# Patient Record
Sex: Female | Born: 1950 | Race: White | Hispanic: No | Smoking: Former smoker
Health system: Southern US, Community
[De-identification: ages and names within clinical notes are randomized; demographics above are authoritative.]

## PROBLEM LIST (undated history)

## (undated) DIAGNOSIS — I10 Essential (primary) hypertension: Secondary | ICD-10-CM

## (undated) DIAGNOSIS — F411 Generalized anxiety disorder: Secondary | ICD-10-CM

## (undated) DIAGNOSIS — E785 Hyperlipidemia, unspecified: Secondary | ICD-10-CM

## (undated) HISTORY — DX: Generalized anxiety disorder: F41.1

## (undated) HISTORY — DX: Hyperlipidemia, unspecified: E78.5

## (undated) HISTORY — PX: OTHER SURGICAL HISTORY: SHX169

## (undated) HISTORY — PX: KNEE SURGERY: SHX244

## (undated) HISTORY — PX: BLADDER SUSPENSION: SHX72

## (undated) HISTORY — DX: Essential (primary) hypertension: I10

---

## 2013-12-14 ENCOUNTER — Other Ambulatory Visit (HOSPITAL_BASED_OUTPATIENT_CLINIC_OR_DEPARTMENT_OTHER): Payer: Self-pay | Admitting: Orthopedic Surgery

## 2013-12-14 ENCOUNTER — Ambulatory Visit (HOSPITAL_BASED_OUTPATIENT_CLINIC_OR_DEPARTMENT_OTHER): Payer: BC Managed Care – PPO

## 2013-12-14 DIAGNOSIS — M25561 Pain in right knee: Secondary | ICD-10-CM

## 2013-12-15 ENCOUNTER — Ambulatory Visit (HOSPITAL_BASED_OUTPATIENT_CLINIC_OR_DEPARTMENT_OTHER): Payer: BC Managed Care – PPO

## 2014-03-28 ENCOUNTER — Encounter: Payer: Self-pay | Admitting: Sports Medicine

## 2014-03-28 ENCOUNTER — Ambulatory Visit (INDEPENDENT_AMBULATORY_CARE_PROVIDER_SITE_OTHER): Payer: BC Managed Care – PPO | Admitting: Sports Medicine

## 2014-03-28 VITALS — BP 132/77 | HR 80 | Ht 65.0 in | Wt 148.0 lb

## 2014-03-28 DIAGNOSIS — H6500 Acute serous otitis media, unspecified ear: Secondary | ICD-10-CM | POA: Diagnosis not present

## 2014-03-28 DIAGNOSIS — L989 Disorder of the skin and subcutaneous tissue, unspecified: Secondary | ICD-10-CM | POA: Diagnosis not present

## 2014-03-28 DIAGNOSIS — L821 Other seborrheic keratosis: Secondary | ICD-10-CM | POA: Insufficient documentation

## 2014-03-28 DIAGNOSIS — M170 Bilateral primary osteoarthritis of knee: Secondary | ICD-10-CM | POA: Insufficient documentation

## 2014-03-28 DIAGNOSIS — M1711 Unilateral primary osteoarthritis, right knee: Secondary | ICD-10-CM

## 2014-03-28 DIAGNOSIS — H6506 Acute serous otitis media, recurrent, bilateral: Secondary | ICD-10-CM

## 2014-03-28 DIAGNOSIS — M1710 Unilateral primary osteoarthritis, unspecified knee: Secondary | ICD-10-CM | POA: Diagnosis not present

## 2014-03-28 DIAGNOSIS — F329 Major depressive disorder, single episode, unspecified: Secondary | ICD-10-CM | POA: Insufficient documentation

## 2014-03-28 DIAGNOSIS — Z299 Encounter for prophylactic measures, unspecified: Secondary | ICD-10-CM

## 2014-03-28 DIAGNOSIS — F419 Anxiety disorder, unspecified: Secondary | ICD-10-CM

## 2014-03-28 DIAGNOSIS — H6590 Unspecified nonsuppurative otitis media, unspecified ear: Secondary | ICD-10-CM | POA: Insufficient documentation

## 2014-03-28 DIAGNOSIS — F39 Unspecified mood [affective] disorder: Secondary | ICD-10-CM

## 2014-03-28 DIAGNOSIS — G4700 Insomnia, unspecified: Secondary | ICD-10-CM

## 2014-03-28 DIAGNOSIS — B001 Herpesviral vesicular dermatitis: Secondary | ICD-10-CM | POA: Insufficient documentation

## 2014-03-28 DIAGNOSIS — B009 Herpesviral infection, unspecified: Secondary | ICD-10-CM

## 2014-03-28 DIAGNOSIS — Z0000 Encounter for general adult medical examination without abnormal findings: Secondary | ICD-10-CM | POA: Insufficient documentation

## 2014-03-28 DIAGNOSIS — E785 Hyperlipidemia, unspecified: Secondary | ICD-10-CM

## 2014-03-28 HISTORY — DX: Insomnia, unspecified: G47.00

## 2014-03-28 HISTORY — DX: Bilateral primary osteoarthritis of knee: M17.0

## 2014-03-28 HISTORY — DX: Major depressive disorder, single episode, unspecified: F32.9

## 2014-03-28 MED ORDER — VALACYCLOVIR HCL 1 G PO TABS: 1000.0000 mg | ORAL_TABLET | Freq: Two times a day (BID) | ORAL | Status: DC

## 2014-03-28 MED ORDER — FLUTICASONE PROPIONATE 50 MCG/ACT NA SUSP
NASAL | Status: DC
Start: 2014-03-28 — End: 2015-05-03

## 2014-03-28 MED ORDER — AZITHROMYCIN 250 MG PO TABS: ORAL_TABLET | ORAL | Status: DC

## 2014-03-28 NOTE — Assessment & Plan Note (Signed)
Continue Lipitor. Checking routine blood work for

## 2014-03-28 NOTE — Assessment & Plan Note (Signed)
Well-controlled with an occasional Ambien.

## 2014-03-28 NOTE — Progress Notes (Signed)
  Subjective:    CC: Establish care.   HPI:  Skin lesion: Present for months, unsure if it's growing or not. Has been picking at it.  Right knee osteoarthritis: Injection by Dr. Luiz BlareGraves several weeks ago, currently doing well, when pain does occur it is mild, persistent, localized to the joint lines without radiation.  Fever blister: Desires acyclovir.  Mood disorder: Stable on Lexapro and Ativan.  Insomnia: Stable with an occasional and being.  Hyperlipidemia: Stable on Lipitor.  Preventative measures: Up-to-date on cervical, breast, and colon cancer screening, Pap smear was in August of 2014, as was mammogram, colonoscopy was 2 years ago. All negative.  Ear pain: Bilateral, with sinus pain and pressure, stuffy nose. Symptoms are moderate, persistent.  Past medical history, Surgical history, Family history not pertinant except as noted below, Social history, Allergies, and medications have been entered into the medical record, reviewed, and no changes needed.   Review of Systems: No headache, visual changes, nausea, vomiting, diarrhea, constipation, dizziness, abdominal pain, skin rash, fevers, chills, night sweats, swollen lymph nodes, weight loss, chest pain, body aches, joint swelling, muscle aches, shortness of breath, mood changes, visual or auditory hallucinations.  Objective:    General: Well Developed, well nourished, and in no acute distress.  Neuro: Alert and oriented x3, extra-ocular muscles intact, sensation grossly intact. Cranial nerves II through XII are intact, motor, sensory, and coordinative functions are all intact. HEENT: Normocephalic, atraumatic, pupils equal round reactive to light, neck supple, no masses, no lymphadenopathy, thyroid nonpalpable. Oropharynx, nasopharynx are unremarkable. There is serous otitis media bilaterally. Skin: Warm and dry, no rashes noted. There is a raised lesion on the right shoulder suggestive of an actinic keratosis. Cardiac: Regular  rate and rhythm, no murmurs rubs or gallops.  Respiratory: Clear to auscultation bilaterally. Not using accessory muscles, speaking in full sentences.  Abdominal: Soft, nontender, nondistended, positive bowel sounds, no masses, no organomegaly.  Musculoskeletal: Shoulder, elbow, wrist, hip, knee, ankle stable, and with full range of motion.  Impression and Recommendations:    The patient was counselled, risk factors were discussed, anticipatory guidance given.

## 2014-03-28 NOTE — Assessment & Plan Note (Signed)
Mammogram and Pap smear were in August of 2014, colonoscopy was 2 years ago.

## 2014-03-28 NOTE — Assessment & Plan Note (Signed)
Valtrex

## 2014-03-28 NOTE — Assessment & Plan Note (Signed)
Flonase, azithromycin, continue Zyrtec

## 2014-03-28 NOTE — Assessment & Plan Note (Signed)
continue Lexapro and Ativan.

## 2014-03-28 NOTE — Assessment & Plan Note (Signed)
Had an injection several weeks ago with Dr. Luiz BlareGraves. If pain recurs we can start viscous supplementation.

## 2014-03-28 NOTE — Assessment & Plan Note (Signed)
Appears to be an actinic keratosis, return for lesion excision.

## 2014-04-05 ENCOUNTER — Telehealth: Payer: Self-pay | Admitting: Emergency Medicine

## 2014-04-05 ENCOUNTER — Emergency Department
Admission: EM | Admit: 2014-04-05 | Discharge: 2014-04-05 | Disposition: A | Discharge status: Home / Self Care | Payer: BC Managed Care – PPO | Source: Home / Self Care | Attending: Family Medicine | Admitting: Family Medicine

## 2014-04-05 ENCOUNTER — Encounter: Payer: Self-pay | Admitting: Emergency Medicine

## 2014-04-05 DIAGNOSIS — IMO0002 Reserved for concepts with insufficient information to code with codable children: Secondary | ICD-10-CM

## 2014-04-05 DIAGNOSIS — S76011A Strain of muscle, fascia and tendon of right hip, initial encounter: Secondary | ICD-10-CM

## 2014-04-05 MED ORDER — MELOXICAM 15 MG PO TABS: 15.0000 mg | ORAL_TABLET | Freq: Every day | ORAL | Status: DC

## 2014-04-05 NOTE — Discharge Instructions (Signed)
May apply heating pad, or heat alternating with ice.  Begin range of motion and stretching exercises.

## 2014-04-05 NOTE — ED Provider Notes (Signed)
CSN: [id number]     Arrival date & time 04/05/14  1030 History   None    Chief Complaint  Patient presents with  . Leg Injury      HPI Comments: Patient fell while water skiing 6 days ago, injuring her right buttock.  She believes that she has a hamstring injury, but her pain is in her right buttock.  She has pain when sitting.  She has been applying ice to the injured area, and used crutches for two days.  The pain has decreased somewhat, and she is able to walk without crutches.  She has developed bruising, but no tenderness, over her right proximal posterior thigh.  Patient is a 63 y.o. female presenting with leg pain. The history is provided by the patient.  Leg Pain Lower extremity pain location: right buttock. Time since incident:  5 days Injury: yes   Mechanism of injury comment:  Fell while water skiing Pain details:    Quality:  Aching   Radiates to:  Does not radiate   Severity:  Moderate   Onset quality:  Sudden   Duration:  5 days   Timing:  Constant   Progression:  Improving Chronicity:  New Prior injury to area:  No Relieved by: ice packs and crutches. Worsened by:  Bearing weight Associated symptoms: decreased ROM, stiffness and swelling   Associated symptoms: no back pain, no muscle weakness, no numbness and no tingling     Past Medical History  Diagnosis Date  . Hypertension   . Hyperlipidemia    History reviewed. No pertinent past surgical history. Family History  Problem Relation Age of Onset  . Heart attack Father   . Hyperlipidemia Father   . Cancer Sister     breast  . Cancer Maternal Aunt     breast  . Heart attack Paternal Uncle    History  Substance Use Topics  . Smoking status: Former Games developermoker  . Smokeless tobacco: Not on file  . Alcohol Use: Yes   OB History   Grav Para Term Preterm Abortions TAB SAB Ect Mult Living                 Review of Systems  Musculoskeletal: Positive for stiffness. Negative for back pain.  All other systems  reviewed and are negative.   Allergies  Review of patient's allergies indicates no active allergies.  Home Medications   Prior to Admission medications   Medication Sig Start Date End Date Taking? Authorizing Provider  atorvastatin (LIPITOR) 10 MG tablet Take 10 mg by mouth daily.    Historical Provider, MD  azithromycin (ZITHROMAX Z-PAK) 250 MG tablet Take 2 tablets (500 mg) on  Day 1,  followed by 1 tablet (250 mg) once daily on Days 2 through 5. 03/28/14   Monica Bectonhomas J Thekkekandam, MD  cetirizine (ZYRTEC) 10 MG tablet Take 10 mg by mouth daily.    Historical Provider, MD  escitalopram (LEXAPRO) 20 MG tablet Take 20 mg by mouth daily.    Historical Provider, MD  fluticasone (FLONASE) 50 MCG/ACT nasal spray One spray in each nostril twice a day, use left hand for right nostril, and right hand for left nostril. 03/28/14   Monica Bectonhomas J Thekkekandam, MD  LORazepam (ATIVAN) 1 MG tablet Take 1 mg by mouth every 8 (eight) hours.    Historical Provider, MD  meloxicam (MOBIC) 15 MG tablet Take 1 tablet (15 mg total) by mouth daily. Take with food each morning 04/05/14   Tera MaterStephen A  Stephanny Tsutsui, MD  valACYclovir (VALTREX) 1000 MG tablet Take 1 tablet (1,000 mg total) by mouth 2 (two) times daily. 03/28/14   Monica Becton, MD  Vitamin D, Cholecalciferol, 1000 UNITS TABS Take by mouth.    Historical Provider, MD  zolpidem (AMBIEN) 10 MG tablet Take 10 mg by mouth at bedtime as needed for sleep.    Historical Provider, MD   BP 118/79  Pulse 74  Temp(Src) 97.9 F (36.6 C) (Oral)  Ht 5\' 5"  (1.651 m)  Wt 148 lb (67.132 kg)  BMI 24.63 kg/m2  SpO2 98% Physical Exam  Nursing note and vitals reviewed. Constitutional: She is oriented to person, place, and time. She appears well-developed and well-nourished. No distress.  HENT:  Head: Atraumatic.  Eyes: Conjunctivae are normal. Pupils are equal, round, and reactive to light.  Neck: Normal range of motion.  Cardiovascular: Normal heart sounds.     Pulmonary/Chest: Breath sounds normal.  Abdominal: There is no tenderness.  Musculoskeletal:       Right hip: She exhibits normal range of motion.       Legs: Right inferior buttock has tenderness to palpation as noted on diagram, and pain is elicited with posterior extension of her right hip.  No hematoma present.  Right hip has normal internal/external passive rotation. Right posterior thigh has ecchymosis present as noted on diagram, but no swelling or tenderness.  Distal neurovascular function is intact.       Neurological: She is alert and oriented to person, place, and time.  Skin: Skin is warm and dry. No rash noted.    ED Course  Procedures  none      MDM   1. Muscle strain of right gluteal region, initial encounter    Begin Mobic May apply heating pad, or heat alternating with ice.  Begin range of motion and stretching exercises. Followup with Dr. Rodney [facility] ([facility]) if not improving about two weeks.     Lattie Haw, MD 04/07/14 1002

## 2014-04-05 NOTE — ED Notes (Signed)
Rt hamstring contusion while waterskiing 6 days ago

## 2014-04-06 LAB — COMPREHENSIVE METABOLIC PANEL
Albumin: 4.1 g/dL (ref 3.5–5.2)
BUN: 19 mg/dL (ref 6–23)
CO2: 29 mEq/L (ref 19–32)
Calcium: 9.8 mg/dL (ref 8.4–10.5)
Chloride: 103 mEq/L (ref 96–112)
Glucose, Bld: 84 mg/dL (ref 70–99)
Potassium: 5.1 mEq/L (ref 3.5–5.3)
Total Protein: 6.4 g/dL (ref 6.0–8.3)

## 2014-04-06 LAB — LIPID PANEL
Cholesterol: 156 mg/dL (ref 0–200)
HDL: 77 mg/dL (ref >39)
LDL Cholesterol: 67 mg/dL (ref 0–99)
Total CHOL/HDL Ratio: 2 ratio
Triglycerides: 62 mg/dL (ref <150)
VLDL: 12 mg/dL (ref 0–40)

## 2014-04-06 LAB — CBC
HCT: 39.1 % (ref 36.0–46.0)
Hemoglobin: 12.7 g/dL (ref 12.0–15.0)
MCH: 30.5 pg (ref 26.0–34.0)
MCHC: 32.5 g/dL (ref 30.0–36.0)
MCV: 94 fL (ref 78.0–100.0)
Platelets: 308 K/uL (ref 150–400)
RBC: 4.16 MIL/uL (ref 3.87–5.11)
RDW: 13.5 % (ref 11.5–15.5)
WBC: 6.4 K/uL (ref 4.0–10.5)

## 2014-04-06 LAB — TSH: TSH: 1.788 u[IU]/mL (ref 0.350–4.500)

## 2014-04-06 LAB — VITAMIN D 25 HYDROXY (VIT D DEFICIENCY, FRACTURES): Vit D, 25-Hydroxy: 70 ng/mL (ref 30–89)

## 2014-04-06 LAB — COMPREHENSIVE METABOLIC PANEL WITH GFR
ALT: 15 U/L (ref 0–35)
AST: 19 U/L (ref 0–37)
Alkaline Phosphatase: 62 U/L (ref 39–117)
Creat: 0.85 mg/dL (ref 0.50–1.10)
Sodium: 139 meq/L (ref 135–145)
Total Bilirubin: 0.6 mg/dL (ref 0.2–1.2)

## 2014-04-06 LAB — HEMOGLOBIN A1C
Hgb A1c MFr Bld: 6 % — ABNORMAL HIGH (ref <5.7)
Mean Plasma Glucose: 126 mg/dL — ABNORMAL HIGH (ref <117)

## 2014-04-08 ENCOUNTER — Ambulatory Visit: Payer: BC Managed Care – PPO | Admitting: Sports Medicine

## 2014-05-17 ENCOUNTER — Ambulatory Visit (INDEPENDENT_AMBULATORY_CARE_PROVIDER_SITE_OTHER): Payer: BC Managed Care – PPO | Admitting: Sports Medicine

## 2014-05-17 ENCOUNTER — Encounter: Payer: Self-pay | Admitting: Sports Medicine

## 2014-05-17 VITALS — BP 111/71 | HR 78 | Ht 65.0 in | Wt 148.0 lb

## 2014-05-17 DIAGNOSIS — M1711 Unilateral primary osteoarthritis, right knee: Secondary | ICD-10-CM

## 2014-05-17 DIAGNOSIS — M1710 Unilateral primary osteoarthritis, unspecified knee: Secondary | ICD-10-CM | POA: Diagnosis not present

## 2014-05-17 MED ORDER — DICLOFENAC SODIUM 2 % TD SOLN: 2.0000 | Freq: Two times a day (BID) | TRANSDERMAL | Status: DC

## 2014-05-17 MED ORDER — MELOXICAM 15 MG PO TABS: ORAL_TABLET | ORAL | Status: DC

## 2014-05-17 NOTE — Progress Notes (Signed)
  Subjective:    CC: Right knee pain  HPI: I have not seen Maria Li in some time now, she has known right knee osteoarthritis and has been seeing Dr. Luiz Blare, her most recent injection was approximately 3 months ago. She is trying to avoid knee surgery for as long as possible, pain is worse at the medial joint line anteriorly and worse with weightbearing. she denies any significant mechanical symptoms. She does have an MRI that she would like me to review. Pain is moderate, persistent.  Past medical history, Surgical history, Family history not pertinant except as noted below, Social history, Allergies, and medications have been entered into the medical record, reviewed, and no changes needed.   Review of Systems: No fevers, chills, night sweats, weight loss, chest pain, or shortness of breath.   Objective:    General: Well Developed, well nourished, and in no acute distress.  Neuro: Alert and oriented x3, extra-ocular muscles intact, sensation grossly intact.  HEENT: Normocephalic, atraumatic, pupils equal round reactive to light, neck supple, no masses, no lymphadenopathy, thyroid nonpalpable.  Skin: Warm and dry, no rashes. Cardiac: Regular rate and rhythm, no murmurs rubs or gallops, no lower extremity edema.  Respiratory: Clear to auscultation bilaterally. Not using accessory muscles, speaking in full sentences.  right Knee: Normal to inspection with no erythema or effusion or obvious bony abnormalities.  tender to palpation at the medial joint line.  ROM normal in flexion and extension and lower leg rotation. Ligaments with solid consistent endpoints including ACL, PCL, LCL, MCL. Negative Mcmurray's and provocative meniscal tests. Non painful patellar compression. Patellar and quadriceps tendons unremarkable. Hamstring and quadriceps strength is normal.  MRI shows a diminutive anterior horn of the meniscus with visible tearing and extrusion.   Procedure: Real-time Ultrasound Guided  Injection of  right knee  Device: GE Logiq E  Verbal informed consent obtained.  Time-out conducted.  Noted no overlying erythema, induration, or other signs of local infection.  Skin prepped in a sterile fashion.  Local anesthesia: Topical Ethyl chloride.  With sterile technique and under real time ultrasound guidance:   2 cc Kenalog 40, 4 cc lidocaine injected easily into the suprapatellar recess.  Completed without difficulty  Pain immediately resolved suggesting accurate placement of the medication.  Advised to call if fevers/chills, erythema, induration, drainage, or persistent bleeding.  Images permanently stored and available for review in the ultrasound unit.  Impression: Technically successful ultrasound guided injection.  Impression and Recommendations:

## 2014-05-17 NOTE — Patient Instructions (Signed)
Osteoarthritis with a degenerative meniscal tear in the anterior horn of the medial meniscus

## 2014-05-17 NOTE — Assessment & Plan Note (Addendum)
There is a degenerative anterior horn of the medial meniscus tear on recent MRI. Injection today. Adding Mobic and Pennsaid. Patient will consider Visco supplementation. Return in a month.

## 2014-05-23 ENCOUNTER — Telehealth: Payer: Self-pay

## 2014-05-23 NOTE — Telephone Encounter (Signed)
Patient advised of recommendations.  

## 2014-05-23 NOTE — Telephone Encounter (Signed)
Definitely not from an injection placed into the knee joint. Ignore symptoms for now, we will discuss it if still present at the followup visit.

## 2014-05-23 NOTE — Telephone Encounter (Signed)
Maria Li had her knee injected last week. She now is having headaches in the morning and leg numbness from the knee to foot. She wanted to know if this was a common side effect to the injection. Please advise.

## 2014-05-28 ENCOUNTER — Telehealth: Payer: Self-pay

## 2014-05-28 NOTE — Telephone Encounter (Signed)
Dajia reports she has mouth sores for 1 week and believes this is due to taking meloxicam. The mouth sores have worsen and she has a cold sore on the lips for a couple of days. She has gargled in warm salt water with little relief. She does have a prescription of valacyclovir. I advised her to start taking as directed on her prescription.

## 2014-05-28 NOTE — Telephone Encounter (Signed)
Yes, take Valtrex, stopped meloxicam, and switch to ibuprofen 800 mg 3 times a day.

## 2014-05-29 NOTE — Telephone Encounter (Signed)
Patient advised of recommendations.  

## 2014-06-14 ENCOUNTER — Ambulatory Visit: Payer: BC Managed Care – PPO | Admitting: Sports Medicine

## 2014-06-20 DIAGNOSIS — M546 Pain in thoracic spine: Secondary | ICD-10-CM

## 2014-06-20 DIAGNOSIS — H6990 Unspecified Eustachian tube disorder, unspecified ear: Secondary | ICD-10-CM | POA: Insufficient documentation

## 2014-06-20 DIAGNOSIS — R351 Nocturia: Secondary | ICD-10-CM

## 2014-06-20 DIAGNOSIS — J309 Allergic rhinitis, unspecified: Secondary | ICD-10-CM

## 2014-06-20 DIAGNOSIS — E7800 Pure hypercholesterolemia, unspecified: Secondary | ICD-10-CM

## 2014-06-20 DIAGNOSIS — S92001A Unspecified fracture of right calcaneus, initial encounter for closed fracture: Secondary | ICD-10-CM

## 2014-06-20 DIAGNOSIS — R42 Dizziness and giddiness: Secondary | ICD-10-CM

## 2014-06-20 DIAGNOSIS — H6980 Other specified disorders of Eustachian tube, unspecified ear: Secondary | ICD-10-CM

## 2014-06-20 DIAGNOSIS — Z780 Asymptomatic menopausal state: Secondary | ICD-10-CM | POA: Insufficient documentation

## 2014-06-20 HISTORY — DX: Allergic rhinitis, unspecified: J30.9

## 2014-06-20 HISTORY — DX: Pure hypercholesterolemia, unspecified: E78.00

## 2014-06-20 HISTORY — DX: Pain in thoracic spine: M54.6

## 2014-06-20 HISTORY — DX: Unspecified eustachian tube disorder, unspecified ear: H69.90

## 2014-06-20 HISTORY — DX: Dizziness and giddiness: R42

## 2014-06-20 HISTORY — DX: Other specified disorders of Eustachian tube, unspecified ear: H69.80

## 2014-06-20 HISTORY — DX: Nocturia: R35.1

## 2014-06-20 HISTORY — DX: Unspecified fracture of right calcaneus, initial encounter for closed fracture: S92.001A

## 2014-08-06 ENCOUNTER — Ambulatory Visit: Payer: BC Managed Care – PPO | Admitting: Sports Medicine

## 2014-10-24 ENCOUNTER — Encounter: Payer: Self-pay | Admitting: Sports Medicine

## 2014-10-24 ENCOUNTER — Ambulatory Visit (INDEPENDENT_AMBULATORY_CARE_PROVIDER_SITE_OTHER): Payer: BLUE CROSS/BLUE SHIELD | Admitting: Sports Medicine

## 2014-10-24 VITALS — BP 128/78 | HR 87 | Ht 65.5 in | Wt 149.0 lb

## 2014-10-24 DIAGNOSIS — H6503 Acute serous otitis media, bilateral: Secondary | ICD-10-CM

## 2014-10-24 DIAGNOSIS — L821 Other seborrheic keratosis: Secondary | ICD-10-CM

## 2014-10-24 MED ORDER — AZITHROMYCIN 250 MG PO TABS: ORAL_TABLET | ORAL | Status: DC

## 2014-10-24 MED ORDER — PREDNISONE 50 MG PO TABS: 50.0000 mg | ORAL_TABLET | Freq: Every day | ORAL | Status: DC

## 2014-10-24 NOTE — Assessment & Plan Note (Signed)
Going to [place] soon, prednisone, azithromycin, oral decongestants. Referral to ENT, this is recurrent and she will likely need marriage ostomy tube placement, she does have associated vertigo with a benign exam.

## 2014-10-24 NOTE — Assessment & Plan Note (Signed)
Cryotherapy on her return.

## 2014-10-24 NOTE — Patient Instructions (Signed)
Use over-the-counter phenylephrine 10 mg daily

## 2014-10-24 NOTE — Progress Notes (Signed)
  Subjective:    CC: ear fullness, dizziness  HPI: Maria Li returns, she is a pleasant 64 year old female with a history of chronic and recurrent serous otitis media with ear fullness and vertigo. We treated her in the recent past with azithromycin, prednisone and Flonase which worked extremely well. Unfortunately for the past several weeks she has started to have a recurrence of swelling and fullness in both years, moderate, persistent, with difficulty hearing.she does have a trip coming up to [place] over the next week, and desires to feel better. Symptoms are predominantly vertiginous, horizontal, with mild hearing loss, and a sensation of fullness.  Past medical history, Surgical history, Family history not pertinant except as noted below, Social history, Allergies, and medications have been entered into the medical record, reviewed, and no changes needed.   Review of Systems: No fevers, chills, night sweats, weight loss, chest pain, or shortness of breath.   Objective:    General: Well Developed, well nourished, and in no acute distress.  Neuro: Alert and oriented x3, extra-ocular muscles intact, sensation grossly intact.  HEENT: Normocephalic, atraumatic, pupils equal round reactive to light, neck supple, no masses, no lymphadenopathy, thyroid nonpalpable. Oropharynx and nasopharynx unremarkable, bilateral serous otitis media. Negative Dix-Hallpike test. Skin: Warm and dry, no rashes.seborrheic keratosis on the left abdomen, 1 cm across. Cardiac: Regular rate and rhythm, no murmurs rubs or gallops, no lower extremity edema.  Respiratory: Clear to auscultation bilaterally. Not using accessory muscles, speaking in full sentences.  Impression and Recommendations:    I spent 40 minutes with this patient, greater than 50% was face-to-face time counseling regarding the above diagnoses

## 2014-11-21 ENCOUNTER — Ambulatory Visit: Payer: BLUE CROSS/BLUE SHIELD | Admitting: Sports Medicine

## 2014-11-28 ENCOUNTER — Ambulatory Visit (INDEPENDENT_AMBULATORY_CARE_PROVIDER_SITE_OTHER): Payer: BLUE CROSS/BLUE SHIELD | Admitting: Sports Medicine

## 2014-11-28 ENCOUNTER — Encounter: Payer: Self-pay | Admitting: Sports Medicine

## 2014-11-28 VITALS — BP 126/83 | HR 63 | Ht 65.5 in | Wt 149.0 lb

## 2014-11-28 DIAGNOSIS — L821 Other seborrheic keratosis: Secondary | ICD-10-CM | POA: Diagnosis not present

## 2014-11-28 DIAGNOSIS — H6506 Acute serous otitis media, recurrent, bilateral: Secondary | ICD-10-CM

## 2014-11-28 NOTE — Assessment & Plan Note (Signed)
Completely resolved with oral medications. She will increase Flonase to twice a day.

## 2014-11-28 NOTE — Assessment & Plan Note (Signed)
Destruction of approximately 30 seborrheic keratoses and skin tags with cryotherapy. Return in 3 weeks to recheck.

## 2014-11-28 NOTE — Progress Notes (Signed)
  Subjective:    CC: Follow-up  HPI: Serous otitis media: Resolved.  Skin lesions: Has multiple lesions that she would like me to look at.  Past medical history, Surgical history, Family history not pertinant except as noted below, Social history, Allergies, and medications have been entered into the medical record, reviewed, and no changes needed.   Review of Systems: No fevers, chills, night sweats, weight loss, chest pain, or shortness of breath.   Objective:    General: Well Developed, well nourished, and in no acute distress.  Neuro: Alert and oriented x3, extra-ocular muscles intact, sensation grossly intact.  HEENT: Normocephalic, atraumatic, pupils equal round reactive to light, neck supple, no masses, no lymphadenopathy, thyroid nonpalpable.  Skin: Warm and dry, there are numerous, greater than 50 seborrheic keratoses on her body. Cardiac: Regular rate and rhythm, no murmurs rubs or gallops, no lower extremity edema.  Respiratory: Clear to auscultation bilaterally. Not using accessory muscles, speaking in full sentences.  Procedure:  Cryodestruction of approximately 30 seborrheic keratoses Consent obtained and verified. Time-out conducted. Noted no overlying erythema, induration, or other signs of local infection. Completed without difficulty using Cryo-Gun. Advised to call if fevers/chills, erythema, induration, drainage, or persistent bleeding.  Impression and Recommendations:

## 2014-12-09 DIAGNOSIS — H8110 Benign paroxysmal vertigo, unspecified ear: Secondary | ICD-10-CM | POA: Diagnosis not present

## 2014-12-09 DIAGNOSIS — H93293 Other abnormal auditory perceptions, bilateral: Secondary | ICD-10-CM | POA: Diagnosis not present

## 2014-12-09 DIAGNOSIS — H938X3 Other specified disorders of ear, bilateral: Secondary | ICD-10-CM | POA: Diagnosis not present

## 2014-12-09 DIAGNOSIS — H6983 Other specified disorders of Eustachian tube, bilateral: Secondary | ICD-10-CM | POA: Diagnosis not present

## 2014-12-11 DIAGNOSIS — Z1231 Encounter for screening mammogram for malignant neoplasm of breast: Secondary | ICD-10-CM | POA: Diagnosis not present

## 2015-01-01 DIAGNOSIS — M94261 Chondromalacia, right knee: Secondary | ICD-10-CM | POA: Diagnosis not present

## 2015-01-01 DIAGNOSIS — M23331 Other meniscus derangements, other medial meniscus, right knee: Secondary | ICD-10-CM | POA: Diagnosis not present

## 2015-01-01 DIAGNOSIS — M2241 Chondromalacia patellae, right knee: Secondary | ICD-10-CM | POA: Diagnosis not present

## 2015-01-07 DIAGNOSIS — M25661 Stiffness of right knee, not elsewhere classified: Secondary | ICD-10-CM | POA: Diagnosis not present

## 2015-01-07 DIAGNOSIS — S83241D Other tear of medial meniscus, current injury, right knee, subsequent encounter: Secondary | ICD-10-CM | POA: Diagnosis not present

## 2015-01-09 ENCOUNTER — Ambulatory Visit: Payer: BLUE CROSS/BLUE SHIELD | Admitting: Sports Medicine

## 2015-01-14 DIAGNOSIS — M25661 Stiffness of right knee, not elsewhere classified: Secondary | ICD-10-CM | POA: Diagnosis not present

## 2015-01-14 DIAGNOSIS — S83241D Other tear of medial meniscus, current injury, right knee, subsequent encounter: Secondary | ICD-10-CM | POA: Diagnosis not present

## 2015-01-16 DIAGNOSIS — M25661 Stiffness of right knee, not elsewhere classified: Secondary | ICD-10-CM | POA: Diagnosis not present

## 2015-01-16 DIAGNOSIS — S83241D Other tear of medial meniscus, current injury, right knee, subsequent encounter: Secondary | ICD-10-CM | POA: Diagnosis not present

## 2015-01-21 DIAGNOSIS — S83241D Other tear of medial meniscus, current injury, right knee, subsequent encounter: Secondary | ICD-10-CM | POA: Diagnosis not present

## 2015-01-21 DIAGNOSIS — M25661 Stiffness of right knee, not elsewhere classified: Secondary | ICD-10-CM | POA: Diagnosis not present

## 2015-01-28 DIAGNOSIS — S83241D Other tear of medial meniscus, current injury, right knee, subsequent encounter: Secondary | ICD-10-CM | POA: Diagnosis not present

## 2015-01-28 DIAGNOSIS — M25661 Stiffness of right knee, not elsewhere classified: Secondary | ICD-10-CM | POA: Diagnosis not present

## 2015-01-28 DIAGNOSIS — Z09 Encounter for follow-up examination after completed treatment for conditions other than malignant neoplasm: Secondary | ICD-10-CM | POA: Diagnosis not present

## 2015-02-27 DIAGNOSIS — R300 Dysuria: Secondary | ICD-10-CM | POA: Diagnosis not present

## 2015-02-27 DIAGNOSIS — N3000 Acute cystitis without hematuria: Secondary | ICD-10-CM | POA: Diagnosis not present

## 2015-02-27 DIAGNOSIS — Z6824 Body mass index (BMI) 24.0-24.9, adult: Secondary | ICD-10-CM | POA: Diagnosis not present

## 2015-04-01 DIAGNOSIS — E780 Pure hypercholesterolemia: Secondary | ICD-10-CM | POA: Diagnosis not present

## 2015-05-03 ENCOUNTER — Encounter: Payer: Self-pay | Admitting: Emergency Medicine

## 2015-05-03 ENCOUNTER — Emergency Department (INDEPENDENT_AMBULATORY_CARE_PROVIDER_SITE_OTHER)
Admission: EM | Admit: 2015-05-03 | Discharge: 2015-05-03 | Disposition: A | Discharge status: Home / Self Care | Payer: Medicare Other | Source: Home / Self Care | Attending: Family Medicine | Admitting: Family Medicine

## 2015-05-03 DIAGNOSIS — K120 Recurrent oral aphthae: Secondary | ICD-10-CM

## 2015-05-03 MED ORDER — TRIAMCINOLONE ACETONIDE 0.1 % MT PSTE: PASTE | OROMUCOSAL | Status: DC

## 2015-05-03 MED ORDER — ACETAMINOPHEN-CODEINE #3 300-30 MG PO TABS: 1.0000 | ORAL_TABLET | ORAL | Status: DC | PRN

## 2015-05-03 MED ORDER — AMOXICILLIN 875 MG PO TABS: 875.0000 mg | ORAL_TABLET | Freq: Two times a day (BID) | ORAL | Status: DC

## 2015-05-03 MED ORDER — VALACYCLOVIR HCL 1 G PO TABS: ORAL_TABLET | ORAL | Status: DC

## 2015-05-03 NOTE — ED Provider Notes (Signed)
CSN: [id number]     Arrival date & time 05/03/15  1357 History   First MD Initiated Contact with Patient 05/03/15 1428     Chief Complaint  Patient presents with  . Mouth Lesions     HPI Comments: Patient has a history of seasonal and perennial rhinitis controlled with Singulair and Flonase.  During the past 6 days she has had increased fatigue, mild sore throat, myalgias, and sores in her mouth.  During the past four days she has sores in her mouth and swelling/soreness under her tongue.   She has a history of cold sores and recurrent sores in her mouth  The history is provided by the patient.    Past Medical History  Diagnosis Date  . Hypertension   . Hyperlipidemia    Past Surgical History  Procedure Laterality Date  . Tummy tuck    . Bladder suspension    . Knee surgery Right 3 months ago   Family History  Problem Relation Age of Onset  . Heart attack Father   . Hyperlipidemia Father   . Cancer Sister     breast  . Cancer Maternal Aunt     breast  . Heart attack Paternal Uncle    Social History  Substance Use Topics  . Smoking status: Former Games developer  . Smokeless tobacco: None  . Alcohol Use: Yes   OB History    No data available     Review of Systems + sore throat + sneezing + occasional cough No pleuritic pain No wheezing + nasal congestion + post-nasal drainage No sinus pain/pressure No itchy/red eyes No earache No hemoptysis No SOB No fever/chills No nausea No vomiting No abdominal pain No diarrhea No urinary symptoms No skin rash + fatigue + myalgias + headache Used OTC meds without relief  Allergies  Review of patient's allergies indicates no known allergies.  Home Medications   Prior to Admission medications   Medication Sig Start Date End Date Taking? Authorizing Provider  amoxicillin (AMOXIL) 875 MG tablet Take 1 tablet (875 mg total) by mouth 2 (two) times daily. (Rx void after 05/10/15) 05/03/15   Lattie Haw, MD  atorvastatin  (LIPITOR) 10 MG tablet Take 10 mg by mouth daily.    Historical Provider, MD  escitalopram (LEXAPRO) 20 MG tablet Take 20 mg by mouth daily.    Historical Provider, MD  triamcinolone (KENALOG) 0.1 % paste Apply to affected areas in mouth BID to TID 05/03/15   Lattie Haw, MD  valACYclovir (VALTREX) 1000 MG tablet Take two tabs by mouth every 12 hours for one day.  Take at onset of cold sore 05/03/15   Lattie Haw, MD  Vitamin D, Cholecalciferol, 1000 UNITS TABS Take by mouth.    Historical Provider, MD  zolpidem (AMBIEN) 10 MG tablet Take 10 mg by mouth at bedtime as needed for sleep.    Historical Provider, MD   Meds Ordered and Administered this Visit  Medications - No data to display  BP 107/68 mmHg  Pulse 77  Temp(Src) 98.2 F (36.8 C) (Oral)  Ht 5\' 5"  (1.651 m)  Wt 147 lb (66.679 kg)  BMI 24.46 kg/m2  SpO2 100% No data found.   Physical Exam Nursing notes and Vital Signs reviewed. Appearance:  Patient appears stated age, and in no acute distress Eyes:  Pupils are equal, round, and reactive to light and accomodation.  Extraocular movement is intact.  Conjunctivae are not inflamed  Ears:  Canals normal.  Tympanic membranes normal.  Nose:  Mildly congested turbinates.  No sinus tenderness.  Mouth:  Two aphthous ulcers on frenulum with erythema and swelling present Pharynx:  Normal Neck:  Supple.   Tender enlarged posterior nodes are palpated bilaterally  Lungs:  Clear to auscultation.  Breath sounds are equal.  Moving air well. Heart:  Regular rate and rhythm without murmurs, rubs, or gallops.  Abdomen:  Nontender without masses or hepatosplenomegaly.  Bowel sounds are present.  No CVA or flank tenderness.  Extremities:  No edema.   Skin:  No rash present.   ED Course  Procedures  none           MDM   1. 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  Rx for Kenalog in [facility] for mouth lesions. If increasing  cold symptoms begin, try the following: Take plain guaifenesin (  extended release tabs such as Mucinex) twice daily, with plenty of water, for cough and congestion.  May add Pseudoephedrine ( , one or two every 4 to 6 hours) for sinus congestion.  Get adequate rest.   May use Afrin nasal spray (or generic oxymetazoline) twice daily for about 5 days and then discontinue.  Also recommend using saline nasal spray several times daily and saline nasal irrigation (AYR is a common brand).  Use Flonase nasal spray each morning after using Afrin nasal spray and saline nasal irrigation. Try warm salt water gargles for sore throat.  May take Delsym Cough Suppressant at bedtime for nighttime cough.  Stop all antihistamines for now, and other non-prescription cough/cold preparations. Begin Amoxicillin if not improving about one week or if persistent fever develops (Given a prescription to hold, with an expiration date)  Follow-up with family doctor if not improving about10 days.     Lattie Haw, MD 05/03/15 1540

## 2015-05-03 NOTE — ED Notes (Signed)
Pt c/o ulcers in the bottom of her mouth x 4 days, sores in her mouth since Monday, some tongue swelling.  Pt has been having body aches x 6 days.  Bilateral ear ringing and possible sinus infection.

## 2015-05-03 NOTE — Discharge Instructions (Signed)
If increasing cold symptoms begin, try the following: Take plain guaifenesin (  extended release tabs such as Mucinex) twice daily, with plenty of water, for cough and congestion.  May add Pseudoephedrine ( , one or two every 4 to 6 hours) for sinus congestion.  Get adequate rest.   May use Afrin nasal spray (or generic oxymetazoline) twice daily for about 5 days and then discontinue.  Also recommend using saline nasal spray several times daily and saline nasal irrigation (AYR is a common brand).  Use Flonase nasal spray each morning after using Afrin nasal spray and saline nasal irrigation. Try warm salt water gargles for sore throat.  May take Delsym Cough Suppressant at bedtime for nighttime cough.  Stop all antihistamines for now, and other non-prescription cough/cold preparations. Begin Amoxicillin if not improving about one week or if persistent fever develops  Follow-up with family doctor if not improving about10 days.    Canker Sores  Canker sores are painful, open sores on the inside of the mouth and cheek. They may be white or yellow. The sores usually heal in 1 to 2 weeks. Women are more likely than men to have recurrent canker sores. CAUSES The cause of canker sores is not well understood. More than one cause is likely. Canker sores do not appear to be caused by certain types of germs (viruses or bacteria). Canker sores may be caused by:  An allergic reaction to certain foods.  Digestive problems.  Not having enough vitamin B12, folic acid, and iron.  Female sex hormones. Sores may come only during certain phases of a menstrual cycle. Often, there is improvement during pregnancy.  Genetics. Some people seem to inherit canker sore problems. Emotional stress and injuries to the mouth may trigger outbreaks, but not cause them.  DIAGNOSIS Canker sores are diagnosed by exam.  TREATMENT  Patients who have frequent bouts of canker sores may have cultures taken of the sores,  blood tests, or allergy tests. This helps determine if their sores are caused by a poor diet, an allergy, or some other preventable or treatable disease.  Vitamins may prevent recurrences or reduce the severity of canker sores in people with poor nutrition.  Numbing ointments can relieve pain. These are available in drug stores without a prescription.  Anti-inflammatory steroid mouth rinses or gels may be prescribed by your caregiver for severe sores.  Oral steroids may be prescribed if you have severe, recurrent canker sores. These strong medicines can cause many side effects and should be used only under the close direction of a dentist or physician.  Mouth rinses containing the antibiotic medicine may be prescribed. They may lessen symptoms and speed healing. Healing usually happens in about 1 or 2 weeks with or without treatment. Certain antibiotic mouth rinses given to pregnant women and young children can permanently stain teeth. Talk to your caregiver about your treatment. HOME CARE INSTRUCTIONS   Avoid foods that cause canker sores for you.  Avoid citrus juices, spicy or salty foods, and coffee until the sores are healed.  Use a soft-bristled toothbrush.  Chew your food carefully to avoid biting your cheek.  Apply topical numbing medicine to the sore to help relieve pain.  Apply a thin paste of baking soda and water to the sore to help heal the sore.  Only use mouth rinses or medicines for pain or discomfort as directed by your caregiver. SEEK MEDICAL CARE IF:   Your symptoms are not better in 1 week.  Your sores are still  present after 2 weeks.  Your sores are very painful.  You have trouble breathing or swallowing.  Your sores come back frequently. Document Released: 12/18/2010 Document Revised: 12/18/2012 Document Reviewed: 12/18/2010 [facility] Patient Information 2015 [place], [place]. This information is not intended to replace advice given to you by your health care  provider. Make sure you discuss any questions you have with your health care provider.

## 2015-05-06 DIAGNOSIS — M545 Low back pain: Secondary | ICD-10-CM | POA: Diagnosis not present

## 2015-05-06 DIAGNOSIS — M797 Fibromyalgia: Secondary | ICD-10-CM | POA: Diagnosis not present

## 2015-05-06 DIAGNOSIS — M9904 Segmental and somatic dysfunction of sacral region: Secondary | ICD-10-CM | POA: Diagnosis not present

## 2015-05-06 DIAGNOSIS — M9903 Segmental and somatic dysfunction of lumbar region: Secondary | ICD-10-CM | POA: Diagnosis not present

## 2015-05-14 DIAGNOSIS — M9903 Segmental and somatic dysfunction of lumbar region: Secondary | ICD-10-CM | POA: Diagnosis not present

## 2015-05-14 DIAGNOSIS — M9901 Segmental and somatic dysfunction of cervical region: Secondary | ICD-10-CM | POA: Diagnosis not present

## 2015-05-14 DIAGNOSIS — M797 Fibromyalgia: Secondary | ICD-10-CM | POA: Diagnosis not present

## 2015-05-14 DIAGNOSIS — M9904 Segmental and somatic dysfunction of sacral region: Secondary | ICD-10-CM | POA: Diagnosis not present

## 2015-05-21 DIAGNOSIS — M5416 Radiculopathy, lumbar region: Secondary | ICD-10-CM | POA: Diagnosis not present

## 2015-05-21 DIAGNOSIS — M9904 Segmental and somatic dysfunction of sacral region: Secondary | ICD-10-CM | POA: Diagnosis not present

## 2015-05-21 DIAGNOSIS — G541 Lumbosacral plexus disorders: Secondary | ICD-10-CM | POA: Diagnosis not present

## 2015-05-21 DIAGNOSIS — M545 Low back pain: Secondary | ICD-10-CM | POA: Diagnosis not present

## 2015-05-26 ENCOUNTER — Encounter: Payer: Self-pay | Admitting: Sports Medicine

## 2015-05-26 ENCOUNTER — Ambulatory Visit (INDEPENDENT_AMBULATORY_CARE_PROVIDER_SITE_OTHER): Payer: Medicare Other | Admitting: Sports Medicine

## 2015-05-26 VITALS — BP 132/78 | HR 67 | Ht 65.5 in | Wt 147.0 lb

## 2015-05-26 DIAGNOSIS — Z0000 Encounter for general adult medical examination without abnormal findings: Secondary | ICD-10-CM

## 2015-05-26 DIAGNOSIS — M1711 Unilateral primary osteoarthritis, right knee: Secondary | ICD-10-CM | POA: Diagnosis not present

## 2015-05-26 DIAGNOSIS — E785 Hyperlipidemia, unspecified: Secondary | ICD-10-CM

## 2015-05-26 DIAGNOSIS — Z299 Encounter for prophylactic measures, unspecified: Secondary | ICD-10-CM

## 2015-05-26 DIAGNOSIS — Z23 Encounter for immunization: Secondary | ICD-10-CM | POA: Diagnosis not present

## 2015-05-26 DIAGNOSIS — Z13820 Encounter for screening for osteoporosis: Secondary | ICD-10-CM

## 2015-05-26 DIAGNOSIS — Z1239 Encounter for other screening for malignant neoplasm of breast: Secondary | ICD-10-CM

## 2015-05-26 NOTE — Assessment & Plan Note (Signed)
Medicare physical as above. Routine blood work, DEXA, mammogram. Pneumococcal 13 and TDaP vaccines. ECG.

## 2015-05-26 NOTE — Assessment & Plan Note (Signed)
Patient prefers to continue with chiropractic medicine until they fail, she will come back for custom orthotics.

## 2015-05-26 NOTE — Progress Notes (Signed)
Subjective:    Maria Li is a 64 y.o. female who presents for a Welcome to Medicare exam.   Primary osteoarthritis of the right knee: She has had an arthroscopy with partial meniscectomy, she does desire to continue with her chiropractor until they fail. Pain is moderate, persistent without radiation.  Review of Systems Complete review of systems obtained and is negative with the exception of what is noted in the history     Objective:    Today's Vitals   05/26/15 1031  BP: 132/78  Pulse: 67  Height: 5' 5.5" (1.664 m)  Weight: 147 lb (66.679 kg)  Body mass index is 24.08 kg/(m^2).  Medications Outpatient Encounter Prescriptions as of 05/26/2015  Medication Sig  . atorvastatin (LIPITOR) 10 MG tablet Take 10 mg by mouth daily.  Marland Kitchen escitalopram (LEXAPRO) 20 MG tablet Take 20 mg by mouth daily.  . valACYclovir (VALTREX) 1000 MG tablet Take two tabs by mouth every 12 hours for one day.  Take at onset of cold sore  . Vitamin D, Cholecalciferol, 1000 UNITS TABS Take by mouth.  . zolpidem (AMBIEN) 10 MG tablet Take 10 mg by mouth at bedtime as needed for sleep.  Marland Kitchen amoxicillin (AMOXIL) 875 MG tablet Take 1 tablet (875 mg total) by mouth 2 (two) times daily. (Rx void after 05/10/15) (Patient not taking: Reported on 05/26/2015)  . triamcinolone (KENALOG) 0.1 % paste Apply to affected areas in mouth BID to TID (Patient not taking: Reported on 05/26/2015)   No facility-administered encounter medications on file as of 05/26/2015.     History: Past Medical History  Diagnosis Date  . Hypertension   . Hyperlipidemia    Past Surgical History  Procedure Laterality Date  . Tummy tuck    . Bladder suspension    . Knee surgery Right 3 months ago    Family History  Problem Relation Age of Onset  . Heart attack Father   . Hyperlipidemia Father   . Cancer Sister     breast  . Cancer Maternal Aunt     breast  . Heart attack Paternal Uncle    Social History   Occupational History   . Not on file.   Social History Main Topics  . Smoking status: Former Games developer  . Smokeless tobacco: Not on file  . Alcohol Use: Yes  . Drug Use: Not on file  . Sexual Activity: Not on file    Tobacco Counseling Counseling given: Not Answered   Immunizations and Health Maintenance  There is no immunization history on file for this patient. Health Maintenance Due  Topic Date Due  . Hepatitis C Screening  26-Aug-1951  . HIV Screening  12/25/1964  . TETANUS/TDAP  12/25/1968  . PAP SMEAR  12/26/1970  . MAMMOGRAM  12/26/1999  . COLONOSCOPY  12/26/1999  . ZOSTAVAX  12/25/2009  . DEXA SCAN  12/26/2014  . PNA vac Low Risk Adult (1 of 2 - PCV13) 12/26/2014  . INFLUENZA VACCINE  04/07/2015    Activities of Daily Living No flowsheet data found.  Physical Exam   General: Well Developed, well nourished, and in no acute distress.  Neuro: Alert and oriented x3, extra-ocular muscles intact, sensation grossly intact. Cranial nerves II through XII are intact, motor, sensory, and coordinative functions are all intact. HEENT: Normocephalic, atraumatic, pupils equal round reactive to light, neck supple, no masses, no lymphadenopathy, thyroid nonpalpable. Oropharynx, nasopharynx, external ear canals are unremarkable. Skin: Warm and dry, no rashes noted.  Cardiac:  Regular rate and rhythm, no murmurs rubs or gallops.  Respiratory: Clear to auscultation bilaterally. Not using accessory muscles, speaking in full sentences.  Abdominal: Soft, nontender, nondistended, positive bowel sounds, no masses, no organomegaly.  Musculoskeletal: Shoulder, elbow, wrist, hip, knee, ankle stable, and with full range of motion.  or other factors deemed appropriate based on the beneficiary's medical and social history and current clinical standards.  Advanced Directives: Not discussed today  Depression screen: Negative  Screening: Negative  Hearing screen: Negative  Further elements will be scanned in.    Assessment:    This is a routine wellness examination for this patient .  Dietary issues and exercise activities discussed: Not necessary    Goals    None     Patient Care Team: Monica Becton, MD as PCP - General (Family Medicine)     Plan:     During the course of the visit the patient was educated and counseled about the following appropriate screening and preventive services:   Vaccines to include Pneumoccal, Influenza, Hepatitis B, Td, Zostavax, HCV  Electrocardiogram  Cardiovascular Disease  Colorectal cancer screening  Bone density screening  Diabetes screening  Glaucoma screening  Mammography/PAP  Nutrition counseling  Her current medications and allergies were reviewed and needed refills of her chronic medications were ordered. The plan for yearly health maintenance was discussed and all orders and referrals were made as appropriate.  Patient Instructions (the written plan) was given to the patient.    Knee osteoarthritis: She will return for custom orthotics.  Rodney Langton, MD 05/26/2015

## 2015-05-26 NOTE — Assessment & Plan Note (Signed)
Checking blood work

## 2015-05-27 DIAGNOSIS — E785 Hyperlipidemia, unspecified: Secondary | ICD-10-CM | POA: Diagnosis not present

## 2015-05-27 LAB — CBC
HCT: 37.7 % (ref 36.0–46.0)
Hemoglobin: 12.8 g/dL (ref 12.0–15.0)
MCH: 31.5 pg (ref 26.0–34.0)
MCHC: 34 g/dL (ref 30.0–36.0)
MCV: 92.9 fL (ref 78.0–100.0)
MPV: 9.1 fL (ref 8.6–12.4)
Platelets: 296 K/uL (ref 150–400)
RBC: 4.06 MIL/uL (ref 3.87–5.11)
RDW: 13.6 % (ref 11.5–15.5)
WBC: 5 K/uL (ref 4.0–10.5)

## 2015-05-28 LAB — COMPREHENSIVE METABOLIC PANEL
ALT: 15 U/L (ref 6–29)
Alkaline Phosphatase: 77 U/L (ref 33–130)
Calcium: 9.5 mg/dL (ref 8.6–10.4)
Chloride: 106 mmol/L (ref 98–110)
Glucose, Bld: 90 mg/dL (ref 65–99)
Potassium: 5.1 mmol/L (ref 3.5–5.3)
Sodium: 142 mmol/L (ref 135–146)
Total Protein: 6.1 g/dL (ref 6.1–8.1)

## 2015-05-28 LAB — LIPID PANEL
Cholesterol: 163 mg/dL (ref 125–200)
HDL: 74 mg/dL (ref >=46)
LDL Cholesterol: 75 mg/dL (ref <130)
Total CHOL/HDL Ratio: 2.2 ratio (ref <=5.0)
Triglycerides: 69 mg/dL (ref <150)
VLDL: 14 mg/dL (ref <30)

## 2015-05-28 LAB — HEMOGLOBIN A1C
Hgb A1c MFr Bld: 6 % — ABNORMAL HIGH (ref <5.7)
Mean Plasma Glucose: 126 mg/dL — ABNORMAL HIGH (ref <117)

## 2015-05-28 LAB — COMPREHENSIVE METABOLIC PANEL WITH GFR
AST: 17 U/L (ref 10–35)
Albumin: 4.1 g/dL (ref 3.6–5.1)
BUN: 19 mg/dL (ref 7–25)
CO2: 28 mmol/L (ref 20–31)
Creat: 0.74 mg/dL (ref 0.50–0.99)
Total Bilirubin: 0.5 mg/dL (ref 0.2–1.2)

## 2015-06-03 ENCOUNTER — Ambulatory Visit (INDEPENDENT_AMBULATORY_CARE_PROVIDER_SITE_OTHER): Payer: Medicare Other | Admitting: Sports Medicine

## 2015-06-03 ENCOUNTER — Encounter: Payer: Self-pay | Admitting: Sports Medicine

## 2015-06-03 VITALS — BP 119/60 | HR 81 | Wt 144.0 lb

## 2015-06-03 DIAGNOSIS — M1711 Unilateral primary osteoarthritis, right knee: Secondary | ICD-10-CM

## 2015-06-03 NOTE — Assessment & Plan Note (Signed)
Custom orthotics as above for severe pes cavus. She is going to continue with her chiropractor for now but I am happy to see her if this fails, she will probably need injection treatments.

## 2015-06-03 NOTE — Progress Notes (Signed)

## 2015-06-05 DIAGNOSIS — Z01419 Encounter for gynecological examination (general) (routine) without abnormal findings: Secondary | ICD-10-CM | POA: Diagnosis not present

## 2015-06-18 DIAGNOSIS — M5412 Radiculopathy, cervical region: Secondary | ICD-10-CM | POA: Diagnosis not present

## 2015-06-18 DIAGNOSIS — M9901 Segmental and somatic dysfunction of cervical region: Secondary | ICD-10-CM | POA: Diagnosis not present

## 2015-06-18 DIAGNOSIS — M9900 Segmental and somatic dysfunction of head region: Secondary | ICD-10-CM | POA: Diagnosis not present

## 2015-07-09 DIAGNOSIS — M9901 Segmental and somatic dysfunction of cervical region: Secondary | ICD-10-CM | POA: Diagnosis not present

## 2015-07-09 DIAGNOSIS — M545 Low back pain: Secondary | ICD-10-CM | POA: Diagnosis not present

## 2015-07-09 DIAGNOSIS — M9903 Segmental and somatic dysfunction of lumbar region: Secondary | ICD-10-CM | POA: Diagnosis not present

## 2015-07-09 DIAGNOSIS — M542 Cervicalgia: Secondary | ICD-10-CM | POA: Diagnosis not present

## 2015-07-29 DIAGNOSIS — R350 Frequency of micturition: Secondary | ICD-10-CM | POA: Diagnosis not present

## 2015-07-29 DIAGNOSIS — Z6824 Body mass index (BMI) 24.0-24.9, adult: Secondary | ICD-10-CM | POA: Diagnosis not present

## 2015-08-07 ENCOUNTER — Ambulatory Visit (INDEPENDENT_AMBULATORY_CARE_PROVIDER_SITE_OTHER): Payer: Medicare Other

## 2015-08-07 ENCOUNTER — Ambulatory Visit (INDEPENDENT_AMBULATORY_CARE_PROVIDER_SITE_OTHER): Payer: Medicare Other | Admitting: Sports Medicine

## 2015-08-07 ENCOUNTER — Encounter: Payer: Self-pay | Admitting: Sports Medicine

## 2015-08-07 VITALS — BP 117/70 | HR 79 | Ht 65.5 in | Wt 146.0 lb

## 2015-08-07 DIAGNOSIS — M545 Low back pain, unspecified: Secondary | ICD-10-CM

## 2015-08-07 DIAGNOSIS — M1711 Unilateral primary osteoarthritis, right knee: Secondary | ICD-10-CM

## 2015-08-07 HISTORY — DX: Low back pain, unspecified: M54.50

## 2015-08-07 MED ORDER — PREDNISONE 50 MG PO TABS: ORAL_TABLET | ORAL | Status: DC

## 2015-08-07 MED ORDER — MELOXICAM 15 MG PO TABS: ORAL_TABLET | ORAL | Status: DC

## 2015-08-07 NOTE — Assessment & Plan Note (Signed)
Formal physical therapy, x-rays, become a prednisone. Return in one month, SI joint injection if no better. Pain today was referable to the SI joint but did have some discogenic components.

## 2015-08-07 NOTE — Progress Notes (Signed)
  Subjective:    CC: "back pain"  HPI: Patient reports back pain that has come on around the time of her surgery. She believes that it is due to the fact that she is favoring her left side since she was unable to use her right leg effectively post-operatively. She says that the pain is worse with prolonged sitting, noting that car rides are when it tends to be the worst. She says that she has been a to a chiropractor in the past for this pain but it "isn't working anymore" so she stopped going. She denies weight loss, weakness, numbness, or tingling. She says that the pain does not radiate.  Past medical history, Surgical history, Family history not pertinant except as noted below, Social history, Allergies, and medications have been entered into the medical record, reviewed, and no changes needed.   Review of Systems: No fevers, chills, night sweats, weight loss, chest pain, or shortness of breath.   Objective:    General: Well Developed, well nourished, and in no acute distress.  Neuro: Alert and oriented x3, extra-ocular muscles intact, sensation grossly intact.  HEENT: Normocephalic, atraumatic, pupils equal round reactive to light, neck supple, no masses, no lymphadenopathy, thyroid nonpalpable.  Skin: Warm and dry, no rashes. Cardiac: Regular rate and rhythm, no murmurs rubs or gallops, no lower extremity edema.  Respiratory: Clear to auscultation bilaterally. Not using accessory muscles, speaking in full sentences. Back Exam:  Inspection: Unremarkable  Motion: Flexion 45 deg, Extension 45 deg, Side Bending to 45 deg bilaterally,  Rotation to 45 deg bilaterally  SLR laying: Negative  XSLR laying: Negative  Palpable tenderness: left SI joint. FABER: negative. Sensory change: Gross sensation intact to all lumbar and sacral dermatomes.  Reflexes: 2+ at both patellar tendons, 2+ at achilles tendons, Babinski's downgoing.  Strength at foot  Plantar-flexion: 5/5 Dorsi-flexion: 5/5  Eversion: 5/5 Inversion: 5/5  Leg strength  Quad: 5/5 Hamstring: 5/5 Hip flexor: 5/5 Hip abductors: 5/5  Gait unremarkable.  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 Will Rx meloxicam, PT, and a 5d course of prednisone. Will also get X-rays to r/o more occult processes and get a baseline. Return to clinic in 1 month for follow up.

## 2015-08-07 NOTE — Assessment & Plan Note (Signed)
Postop partial meniscectomy doing well.

## 2015-08-11 ENCOUNTER — Ambulatory Visit (INDEPENDENT_AMBULATORY_CARE_PROVIDER_SITE_OTHER): Payer: Medicare Other | Admitting: Rehabilitative and Restorative Service Providers"

## 2015-08-11 ENCOUNTER — Encounter: Payer: Self-pay | Admitting: Rehabilitative and Restorative Service Providers"

## 2015-08-11 DIAGNOSIS — M623 Immobility syndrome (paraplegic): Secondary | ICD-10-CM | POA: Diagnosis not present

## 2015-08-11 DIAGNOSIS — M2560 Stiffness of unspecified joint, not elsewhere classified: Secondary | ICD-10-CM

## 2015-08-11 DIAGNOSIS — M5442 Lumbago with sciatica, left side: Secondary | ICD-10-CM | POA: Diagnosis present

## 2015-08-11 DIAGNOSIS — Z7409 Other reduced mobility: Secondary | ICD-10-CM | POA: Diagnosis not present

## 2015-08-11 NOTE — Therapy (Signed)
[facility] [address] [place], [place], [address] Phone: [phone]   Fax:  [phone]  Physical Therapy Evaluation  Patient Details  Name: Nolene Bernheimhyllis G Knechtel MRN: [id number] Date of Birth: 11/17/1950 Referring Provider: Dr. Karie Schwalbe  Encounter Date: 08/11/2015      PT End of Session - 08/11/15 1607    Visit Number 1   Number of Visits 12   Date for PT Re-Evaluation 09/22/15   PT Start Time 1516   PT Stop Time 1602   PT Time Calculation (min) 46 min   Activity Tolerance Patient tolerated treatment well      Past Medical History  Diagnosis Date  . Hypertension   . Hyperlipidemia     Past Surgical History  Procedure Laterality Date  . Tummy tuck    . Bladder suspension    . Knee surgery Right 3 months ago    There were no vitals filed for this visit.  Visit Diagnosis:  Left-sided low back pain with left-sided sciatica - Plan: PT plan of care cert/re-cert  Stiffness due to immobility - Plan: PT plan of care cert/re-cert  Impaired functional mobility and endurance - Plan: PT plan of care cert/re-cert      Subjective Assessment - 08/11/15 1520    Subjective Pt reports Rt knee meniscus tear ~ yrs ago with surgery 04/16. Due to limping with Rt knee pain developed LBP and L sided pain.    Pertinent History Meniscus scope 04/16; PT in [place] following knee surgery   How long can you sit comfortably? 30-60 min    How long can you stand comfortably? 20-30 min    How long can you walk comfortably? 30 min    Diagnostic tests xrays - arthritic changes DDD   Patient Stated Goals get rid of back pain and get to normal    Currently in Pain? Yes   Pain Score 4    Pain Location Back   Pain Orientation Left   Pain Descriptors / Indicators Dull;Aching   Pain Type Chronic pain   Pain Onset More than a month ago   Pain Frequency Intermittent   Aggravating Factors  driving; sitting in the bed   Pain Relieving Factors hot  bath; hot and icey pads; aleve            [facility] - 08/11/15 0001    Assessment   Medical Diagnosis Lt LBP    Referring Provider Dr. Karie Schwalbe   Onset Date/Surgical Date 05/23/15   Hand Dominance Right   Next MD Visit 1/17   Prior Therapy PT for Rt knee    Precautions   Precautions None   Balance Screen   Has the patient fallen in the past 6 months No   Has the patient had a decrease in activity level because of a fear of falling?  No   Is the patient reluctant to leave their home because of a fear of falling?  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    Leisure Pharmacologistlaundry; cooking; gym 2 x/wk but stopped about 3 months ago because trainer was doing "too much"    Observation/Other Assessments   Focus on Therapeutic Outcomes (FOTO)  60% limitation    Sensation   Additional Comments WFL's per pt report  AROM   AROM Assessment Site --  WFL's lumbar spine except as noted   Right/Left Hip --  WFL's bilat hips/knees   Lumbar Extension 55%   Strength   Overall Strength Comments 5/5 bilat LE's    Flexibility   Hamstrings > 110-115 deg bilat   Quadriceps Rt knee flex 119 deg; Lt 116 deg prone   ITB tight Rt only   Piriformis WFL's bilat    Palpation   Palpation comment mild tenderness Lt lower lumbar QL/paraspinals                    [facility] Adult PT Treatment/Exercise - 08/11/15 0001    Therapeutic Activites    Therapeutic Activities --  avoid sitting propped in bed   Lumbar Exercises: Stretches   Press Ups Limitations 2-3 sec x 10    Quad Stretch 3 reps;30 seconds  prone with strap    ITB Stretch 3 reps;30 seconds  with strap Lt LE only    Lumbar Exercises: Supine   AB Set Limitations 3 part core 10 sec x 10   Bridge 10 reps  10 sec hold with 3 part core                  PT Education - 08/11/15 1550    Education provided Yes   Education Details Avoid sitting propped on husband type pillow; spine mechanics; HEP   Person(s) Educated Patient   Methods Explanation;Demonstration;Tactile cues;Verbal cues;Handout   Comprehension Verbalized understanding;Returned demonstration;Verbal cues required;Tactile cues required             PT Long Term Goals - 08/11/15 1612    PT LONG TERM GOAL #1   Title Increase trunk extension to 75-80% of range 09/02/15   Time 3   Period Weeks   Status New   PT LONG TERM GOAL #2   Title Pt reports that she is no longer sitting for 1-2 hours each morning propped in bed working on her computer 09/02/15   Time 3   Period Weeks   Status New   PT LONG TERM GOAL #3   Title I in HEP for discharge 09/22/15   Time 6   Period Weeks   Status New   PT LONG TERM GOAL #4   Title Pain free sitting for 1-2 hours 09/22/15   Time 6   Period Weeks   Status New   PT LONG TERM GOAL #5   Title Improve FOTO to </= 44% limitation 09/22/15   Time 6   Period Weeks   Status New               Plan - 08/11/15 1608    Clinical Impression Statement Pt presents with 3-4 month history of Lt LBP and Lt LE pain into knee on an intermitent basis. She has tightness in trunk extension and Bilat LE's; decreased activity level; pain with prolonged sitting; poor body mechanics. She will benefit from PT to address problems identified and progress pt to I HEP/gym program.     Pt will benefit from skilled therapeutic intervention in order to improve on the following deficits Improper body mechanics;Decreased mobility;Decreased endurance;Decreased activity tolerance;Pain   Rehab Potential Good   PT Frequency 2x / week   PT Duration 6 weeks   PT Treatment/Interventions Patient/family education;ADLs/Self Care Home Management;Therapeutic exercise;Therapeutic activities;Manual techniques;Dry needling;Moist Heat;Electrical  Stimulation;Cryotherapy;Ultrasound   PT Next Visit Plan progress with core stabilization   PT Home Exercise Plan pt to avoid  sitting propped in bed; HEP    Consulted and Agree with Plan of Care Patient         Problem List Patient Active Problem List   Diagnosis Date Noted  . Left low back pain 08/07/2015  . Osteoarthritis of right knee 03/28/2014  . Preventive measure 03/28/2014  . Seborrheic keratosis 03/28/2014  . Mood disorder (HCC) 03/28/2014  . Hyperlipidemia 03/28/2014  . Insomnia 03/28/2014  . Fever blister 03/28/2014    Celyn Rober Minion PT, MPH  08/11/2015, 4:19 PM  [facility] [address] [place], [place], [address] Phone: [phone]   Fax:  [phone]  Name: ZULEMA PULASKI MRN: [id number] Date of Birth: [birth year]

## 2015-08-11 NOTE — Patient Instructions (Addendum)
Trunk: Prone Extension (Press-Ups)    Lie on stomach on firm, flat surface. Relax bottom and legs. Raise chest in air with elbows straight. Keep hips flat on surface, sag stomach. Hold __2-3__ seconds. Repeat __10__ times. Do _2-3___ sessions per day. CAUTION: Movement should be gentle and slow.   Hip Flexor, Quadricep Stretch: Belly Down (Strap)    Engage stable pelvic tilt. Hold top of foot with hand or strap. Hold for _30 sec  Repeat _3  times each leg.  Abdominal Bracing With Pelvic Floor (Hook-Lying)    With neutral spine, tighten pelvic floor and abdominals sucking belly button to back bone; tighten muscles in back at wiast. Hold 10 sec  Repeat _10__ times. Do several  times a day. Progress to do this sitting; standing; walking; with exercise and functional activities.   Bridging    Slowly raise buttocks from floor, keeping stomach tight. Hold 10 - 20 sec Repeat __10__ times per set. Do _1-2___ sets per session. Do __1-2__ sessions per day.    Outer Hip Stretch: Reclined IT Band Stretch (Strap)    Strap around opposite foot, pull across only as far as possible with shoulders on mat. Hold for __30 sec  Repeat __3__ times each leg.

## 2015-08-18 ENCOUNTER — Encounter: Payer: Self-pay | Admitting: Rehabilitative and Restorative Service Providers"

## 2015-08-18 ENCOUNTER — Ambulatory Visit (INDEPENDENT_AMBULATORY_CARE_PROVIDER_SITE_OTHER): Payer: Medicare Other | Admitting: Rehabilitative and Restorative Service Providers"

## 2015-08-18 DIAGNOSIS — M623 Immobility syndrome (paraplegic): Secondary | ICD-10-CM

## 2015-08-18 DIAGNOSIS — M5442 Lumbago with sciatica, left side: Secondary | ICD-10-CM | POA: Diagnosis not present

## 2015-08-18 DIAGNOSIS — Z7409 Other reduced mobility: Secondary | ICD-10-CM

## 2015-08-18 DIAGNOSIS — M2560 Stiffness of unspecified joint, not elsewhere classified: Secondary | ICD-10-CM

## 2015-08-18 NOTE — Patient Instructions (Signed)
Gluteal Sets    Tighten buttocks while pressing pelvis to floor. Hold _10___ seconds. Repeat _10__ times per set. Do __1-2__ sets per session. Do __1__ sessions per day.   Upper Extremity Extension (All-Fours)    Tighten stomach and raise left arm parallel to floor. Keep trunk rigid. Hold 2-3 sec Repeat __10__ times per set. Do __1-2__ sets per session. Do _1__ sessions per day.  Same position as above straighten one leg out behind you lifting toe off the surface 1-2 inches.  Hip Extension (All-Fours)    Lift right leg back with knee slightly flexed. Do not arch neck or back. Repeat __10__ times per set. Do _1-2___ sets per session. Do _1___ sessions per day.   Upper / Lower Extremity Extension (All-Fours)    Tighten stomach and raise right leg 1-2 inches off surface and left arm. Keep trunk rigid engaging core.  Repeat with the left leg and the right arm. Hold 2-3 sec Repeat __10__ times per set. Do _1-2___ sets per session. Do __1__ sessions per day.  Functional Quadriceps: Sit to Stand   Sit on edge of chair, feet flat on floor. Stand upright, extending knees fully. Repeat _19___ times per set. Do __1__ sets per session. Do _1___ sessions per day.  Forward    Facing step, place one leg on step, flexed at hip. Step up slowly, bringing hips in line with knee and shoulder. Bring other foot onto step. Reverse process to step back down. Repeat with other leg. Do __1 min __ repetitions, __1-2 __ sets with each leg. Start with 4 inch step to protect knee    Lateral Step Up    Stand to side of step. Step up with left leg. Slowly touch opposite heel down. Perform _1 min  __ reps. 1-2 sets with each leg  Use 4 inch step to protect knee.

## 2015-08-18 NOTE — Therapy (Signed)
[facility] [address] [place] [address] [place], [place], [address] Phone: [phone]   Fax:  [phone]  Physical Therapy Treatment  Patient Details  Name: Maria Li MRN: [id number] Date of Birth: 29-Mar-1951 Referring Provider: T  Encounter Date: 08/18/2015      PT End of Session - 08/18/15 1154    Visit Number 2   Number of Visits 12   Date for PT Re-Evaluation 09/22/15   PT Start Time 1153   PT Stop Time 1242   PT Time Calculation (min) 49 min   Activity Tolerance Patient tolerated treatment well      Past Medical History  Diagnosis Date  . Hypertension   . Hyperlipidemia     Past Surgical History  Procedure Laterality Date  . Tummy tuck    . Bladder suspension    . Knee surgery Right 3 months ago    There were no vitals filed for this visit.  Visit Diagnosis:  Left-sided low back pain with left-sided sciatica  Stiffness due to immobility  Impaired functional mobility and endurance      Subjective Assessment - 08/18/15 1155    Subjective LBP improved. Continues to have Rt knee pain which she feels may be related to weather.    Currently in Pain? No/denies   Multiple Pain Sites Yes            [facility] PT Assessment - 08/18/15 0001    Assessment   Medical Diagnosis Lt LBP    Referring Provider T   Onset Date/Surgical Date 05/23/15   Hand Dominance Right   Next MD Visit 1/17   Prior Therapy PT for Rt knee    Flexibility   ITB improved ITband flexibility noted   Palpation   Palpation comment mild tenderness Lt lower lumbar QL/paraspinals                      [facility] Adult PT Treatment/Exercise - 08/18/15 0001    Lumbar Exercises: Stretches   Passive Hamstring Stretch 1 rep;30 seconds   Quad Stretch 1 rep;30 seconds   ITB Stretch 1 rep;30 seconds   Lumbar Exercises: Standing   Other Standing Lumbar Exercises step up fwd 1 min x2 each LE; lateral heel touch 1 min x1 set each LE     Lumbar Exercises: Seated   Sit to Stand 10 reps   Sit to Stand Limitations limit reps to 10 due to knee symptoms    Lumbar Exercises: Supine   AB Set Limitations 3 part core 10 sec x 10   Bridge 10 reps  10 sec hold with 3 part core    Bridge Limitations bridge with adductor squeeze 5 sec hold x 10   with 3 part core    Lumbar Exercises: Prone   Other Prone Lumbar Exercises glut set 10 sec hold x 10    Lumbar Exercises: Quadruped   Single Arm Raise 10 reps;2 seconds   Straight Leg Raise 10 reps;2 seconds   Opposite Arm/Leg Raise 15 reps;2 seconds                PT Education - 08/18/15 1241    Education provided Yes   Education Details body mechanics; HEP   Person(s) Educated Patient   Methods Explanation;Demonstration;Tactile cues;Verbal cues;Handout   Comprehension Verbalized understanding;Returned demonstration;Verbal cues required;Tactile cues required             PT Long Term Goals - 08/18/15 1245  PT LONG TERM GOAL #1   Title Increase trunk extension to 75-80% of range 09/02/15   Time 3   Period Weeks   Status On-going   PT LONG TERM GOAL #2   Title Pt reports that she is no longer sitting for 1-2 hours each morning propped in bed working on her computer 09/02/15   Time 3   Period Weeks   Status On-going   PT LONG TERM GOAL #3   Title I in HEP for discharge 09/22/15   Time 6   Period Weeks   Status On-going   PT LONG TERM GOAL #4   Title Pain free sitting for 1-2 hours 09/22/15   Time 6   Period Weeks   Status On-going   PT LONG TERM GOAL #5   Title Improve FOTO to </= 44% limitation 09/22/15   Time 6   Period Weeks   Status On-going               Plan - 08/18/15 1242    Clinical Impression Statement Improving LBP with change iin habits - (working to avoid sitting with back rounded in bed); working on HEP some. Tolerated increase in exercise well today. Progressing toward stated goals of treatment.    Pt will benefit from skilled  therapeutic intervention in order to improve on the following deficits Improper body mechanics;Decreased mobility;Decreased endurance;Decreased activity tolerance;Pain   Rehab Potential Good   PT Frequency 2x / week   PT Duration 6 weeks   PT Treatment/Interventions Patient/family education;ADLs/Self Care Home Management;Therapeutic exercise;Therapeutic activities;Manual techniques;Dry needling;Moist Heat;Electrical Stimulation;Cryotherapy;Ultrasound   PT Next Visit Plan progress with core stabilization   PT Home Exercise Plan pt to avoid sitting propped in bed; HEP    Consulted and Agree with Plan of Care Patient        Problem List Patient Active Problem List   Diagnosis Date Noted  . Left low back pain 08/07/2015  . Osteoarthritis of right knee 03/28/2014  . Preventive measure 03/28/2014  . Seborrheic keratosis 03/28/2014  . Mood disorder (HCC) 03/28/2014  . Hyperlipidemia 03/28/2014  . Insomnia 03/28/2014  . Fever blister 03/28/2014    Takyla Kuchera Rober MinionP Vontae Court PT, MPH  08/18/2015, 12:50 PM  [facility] [address] [place] [address] [place], [place], [address] Phone: [phone]   Fax:  [phone]  Name: Maria Li MRN: [id number] Date of Birth: 1951-04-29

## 2015-08-21 ENCOUNTER — Encounter: Payer: Self-pay | Admitting: Rehabilitative and Restorative Service Providers"

## 2015-08-21 ENCOUNTER — Ambulatory Visit (INDEPENDENT_AMBULATORY_CARE_PROVIDER_SITE_OTHER): Payer: Medicare Other | Admitting: Rehabilitative and Restorative Service Providers"

## 2015-08-21 DIAGNOSIS — M5442 Lumbago with sciatica, left side: Secondary | ICD-10-CM | POA: Diagnosis not present

## 2015-08-21 DIAGNOSIS — M2560 Stiffness of unspecified joint, not elsewhere classified: Secondary | ICD-10-CM

## 2015-08-21 DIAGNOSIS — Z7409 Other reduced mobility: Secondary | ICD-10-CM | POA: Diagnosis not present

## 2015-08-21 DIAGNOSIS — M623 Immobility syndrome (paraplegic): Secondary | ICD-10-CM | POA: Diagnosis not present

## 2015-08-21 NOTE — Patient Instructions (Signed)
Pelvic Press    Place hands under belly between navel and pubic bone, palms up. Feel pressure on hands. Increase pressure on hands by pressing pelvis down. This is NOT a pelvic tilt.  Hold _5__ seconds. Relax. Repeat _10__ times.   Repeat the pelvic press as above and alternately bend one knee up and lower then repeat with the opposite knee - 10 reps on each leg.

## 2015-08-21 NOTE — Therapy (Signed)
[facility] [address] [place] [address] [place], [place], [address] Phone: [phone]   Fax:  [phone]  Physical Therapy Treatment  Patient Details  Name: Maria Li MRN: [id number] Date of Birth: 1951-08-13 Referring Provider: T  Encounter Date: 08/21/2015      PT End of Session - 08/21/15 1140    Visit Number 3   Number of Visits 12   Date for PT Re-Evaluation 09/22/15   PT Start Time 1138   PT Stop Time 1235   PT Time Calculation (min) 57 min   Activity Tolerance Patient tolerated treatment well      Past Medical History  Diagnosis Date  . Hypertension   . Hyperlipidemia     Past Surgical History  Procedure Laterality Date  . Tummy tuck    . Bladder suspension    . Knee surgery Right 3 months ago    There were no vitals filed for this visit.  Visit Diagnosis:  Left-sided low back pain with left-sided sciatica  Stiffness due to immobility  Impaired functional mobility and endurance      Subjective Assessment - 08/21/15 1139    Subjective Soreness in LB after exercises last visit. Resolved in a couple of days and after she soaked in her tub.    Currently in Pain? No/denies                         [facility] Adult PT Treatment/Exercise - 08/21/15 0001    Lumbar Exercises: Stretches   Passive Hamstring Stretch 1 rep;30 seconds   Press Ups Limitations 2-3 sec x 10    Quad Stretch 1 rep;30 seconds   ITB Stretch 1 rep;30 seconds   Lumbar Exercises: Aerobic   Stationary Bike Nu step L5 x5'    Lumbar Exercises: Standing   Other Standing Lumbar Exercises step up fwd 1 min x2 each LE; lateral heel touch 1 min x1 set each LE    Other Standing Lumbar Exercises SLS on blue TB foam 1 min x2 each LE    Lumbar Exercises: Seated   Sit to Stand 10 reps   Lumbar Exercises: Supine   AB Set Limitations 3 part core 10 sec x 10   Bridge 10 reps  10 sec hold with 3 part core    Bridge Limitations  bridge with adductor squeeze 5 sec hold x 10   with 3 part core    Other Supine Lumbar Exercises hip ext legs on orange swiss ball with core 5 sec x 10    Lumbar Exercises: Prone   Other Prone Lumbar Exercises pelvic press 5 sec x 10; alt knee flexion x10;   Lumbar Exercises: Quadruped   Single Arm Raise 10 reps;2 seconds   Straight Leg Raise 10 reps;2 seconds   Opposite Arm/Leg Raise 15 reps;2 seconds                PT Education - 08/21/15 1224    Education provided Yes   Education Details HEP   Person(s) Educated Patient   Methods Explanation;Demonstration;Tactile cues;Verbal cues;Handout   Comprehension Verbalized understanding;Returned demonstration;Verbal cues required;Tactile cues required             PT Long Term Goals - 08/18/15 1245    PT LONG TERM GOAL #1   Title Increase trunk extension to 75-80% of range 09/02/15   Time 3   Period Weeks   Status On-going   PT LONG TERM GOAL #2  Title Pt reports that she is no longer sitting for 1-2 hours each morning propped in bed working on her computer 09/02/15   Time 3   Period Weeks   Status On-going   PT LONG TERM GOAL #3   Title I in HEP for discharge 09/22/15   Time 6   Period Weeks   Status On-going   PT LONG TERM GOAL #4   Title Pain free sitting for 1-2 hours 09/22/15   Time 6   Period Weeks   Status On-going   PT LONG TERM GOAL #5   Title Improve FOTO to </= 44% limitation 09/22/15   Time 6   Period Weeks   Status On-going               Plan - 08/21/15 1227    Clinical Impression Statement Improving LBP and knee strength. Progressing well toward stated goals of therapy.    Pt will benefit from skilled therapeutic intervention in order to improve on the following deficits Improper body mechanics;Decreased mobility;Decreased endurance;Decreased activity tolerance;Pain   Rehab Potential Good   PT Frequency 2x / week   PT Duration 6 weeks   PT Treatment/Interventions Patient/family  education;ADLs/Self Care Home Management;Therapeutic exercise;Therapeutic activities;Manual techniques;Dry needling;Moist Heat;Electrical Stimulation;Cryotherapy;Ultrasound   PT Next Visit Plan progress with core stabilization   PT Home Exercise Plan pt to avoid sitting propped in bed; HEP    Consulted and Agree with Plan of Care Patient        Problem List Patient Active Problem List   Diagnosis Date Noted  . Left low back pain 08/07/2015  . Osteoarthritis of right knee 03/28/2014  . Preventive measure 03/28/2014  . Seborrheic keratosis 03/28/2014  . Mood disorder (HCC) 03/28/2014  . Hyperlipidemia 03/28/2014  . Insomnia 03/28/2014  . Fever blister 03/28/2014    Gauge Winski Rober Minion  PT, MPH   08/21/2015, 12:32 PM  [facility] [address] [place] [address] [place], [place], [address] Phone: [phone]   Fax:  [phone]  Name: Maria Li MRN: [id number] Date of Birth: [birth year]

## 2015-08-26 ENCOUNTER — Encounter: Payer: Self-pay | Admitting: Rehabilitative and Restorative Service Providers"

## 2015-08-26 ENCOUNTER — Ambulatory Visit (INDEPENDENT_AMBULATORY_CARE_PROVIDER_SITE_OTHER): Payer: Medicare Other | Admitting: Rehabilitative and Restorative Service Providers"

## 2015-08-26 DIAGNOSIS — M5442 Lumbago with sciatica, left side: Secondary | ICD-10-CM | POA: Diagnosis not present

## 2015-08-26 DIAGNOSIS — Z7409 Other reduced mobility: Secondary | ICD-10-CM | POA: Diagnosis not present

## 2015-08-26 DIAGNOSIS — M623 Immobility syndrome (paraplegic): Secondary | ICD-10-CM

## 2015-08-26 DIAGNOSIS — M2560 Stiffness of unspecified joint, not elsewhere classified: Secondary | ICD-10-CM

## 2015-08-26 NOTE — Therapy (Signed)
[facility] [address] [place] [place] [address] [place], [place], [address] Phone: [phone]   Fax:  [phone]  Physical Therapy Treatment  Patient Details  Name: Maria Li MRN: [id number] Date of Birth: Mar 26, 1951 Referring Provider: T  Encounter Date: 08/26/2015      PT End of Session - 08/26/15 1210    Visit Number 4   Number of Visits 12   Date for PT Re-Evaluation 09/22/15   PT Start Time 1138   PT Stop Time 1236   PT Time Calculation (min) 58 min   Activity Tolerance Patient tolerated treatment well      Past Medical History  Diagnosis Date  . Hypertension   . Hyperlipidemia     Past Surgical History  Procedure Laterality Date  . Tummy tuck    . Bladder suspension    . Knee surgery Right 3 months ago    There were no vitals filed for this visit.  Visit Diagnosis:  Left-sided low back pain with left-sided sciatica  Stiffness due to immobility  Impaired functional mobility and endurance      Subjective Assessment - 08/26/15 1208    Subjective Not much time for exercise with Christmas activities. No pain in the LB following last treatment. Did have some Lt knee pian yesterday.    Currently in Pain? No/denies            [facility] PT Assessment - 08/26/15 0001    Assessment   Medical Diagnosis Lt LBP    Referring Provider T   Onset Date/Surgical Date 05/23/15   Hand Dominance Right   Next MD Visit 1/17   Prior Therapy PT for Rt knee    AROM   Lumbar Extension 55%   Flexibility   ITB improved ITband flexibility noted   Palpation   Palpation comment mild tenderness Lt lower lumbar QL/paraspinals                      [facility] Adult PT Treatment/Exercise - 08/26/15 0001    Lumbar Exercises: Stretches   Passive Hamstring Stretch 1 rep;30 seconds   Press Ups Limitations 2-3 sec x 10    Quad Stretch 1 rep;30 seconds   ITB Stretch 1 rep;30 seconds   Lumbar Exercises: Aerobic   Stationary  Bike Nu step L5 x5'    Elliptical L2 x 5 min    Lumbar Exercises: Supine   AB Set Limitations 3 part core 10 sec x 10   Bridge Limitations bridge with adductor squeeze 5 sec hold x 10   with 3 part core    Other Supine Lumbar Exercises hip ext legs on orange swiss ball with core 5 sec x 10    Other Supine Lumbar Exercises quad set 10sec x 10; SLR in ER 5 sec x 10    Lumbar Exercises: Prone   Other Prone Lumbar Exercises pelvic press 5 sec x 10; alt knee flexion x10; alt leg lift x10                 PT Education - 08/26/15 1226    Education provided Yes   Education Details HEP   Person(s) Educated Patient   Methods Explanation;Demonstration;Tactile cues;Verbal cues;Handout   Comprehension Verbalized understanding;Returned demonstration;Verbal cues required;Tactile cues required             PT Long Term Goals - 08/18/15 1245    PT LONG TERM GOAL #1   Title Increase trunk extension to 75-80% of  range 09/02/15   Time 3   Period Weeks   Status On-going   PT LONG TERM GOAL #2   Title Pt reports that she is no longer sitting for 1-2 hours each morning propped in bed working on her computer 09/02/15   Time 3   Period Weeks   Status On-going   PT LONG TERM GOAL #3   Title I in HEP for discharge 09/22/15   Time 6   Period Weeks   Status On-going   PT LONG TERM GOAL #4   Title Pain free sitting for 1-2 hours 09/22/15   Time 6   Period Weeks   Status On-going   PT LONG TERM GOAL #5   Title Improve FOTO to </= 44% limitation 09/22/15   Time 6   Period Weeks   Status On-going               Plan - 08/26/15 1236    Clinical Impression Statement No LBP but has had some Rt knee pain. Tolerates exercses well. Progressing well toward stated goals of therapy.    Pt will benefit from skilled therapeutic intervention in order to improve on the following deficits Improper body mechanics;Decreased mobility;Decreased endurance;Decreased activity tolerance;Pain   Rehab  Potential Good   PT Frequency 2x / week   PT Duration 6 weeks   PT Treatment/Interventions Patient/family education;ADLs/Self Care Home Management;Therapeutic exercise;Therapeutic activities;Manual techniques;Dry needling;Moist Heat;Electrical Stimulation;Cryotherapy;Ultrasound   PT Next Visit Plan progress with core stabilization   PT Home Exercise Plan pt to avoid sitting propped in bed; HEP    Consulted and Agree with Plan of Care Patient        Problem List Patient Active Problem List   Diagnosis Date Noted  . Left low back pain 08/07/2015  . Osteoarthritis of right knee 03/28/2014  . Preventive measure 03/28/2014  . Seborrheic keratosis 03/28/2014  . Mood disorder (HCC) 03/28/2014  . Hyperlipidemia 03/28/2014  . Insomnia 03/28/2014  . Fever blister 03/28/2014    Alyona Romack Rober Minion PT, MPH  08/26/2015, 12:39 PM  [facility] [address] [place] [address] [place] [address] [place], [place], [address] Phone: [phone]   Fax:  [phone]  Name: Maria Li MRN: [id number] Date of Birth: Apr 27, 1951

## 2015-08-26 NOTE — Patient Instructions (Signed)
Quad Set   Folded hand towel under back of knee  With other leg bent, foot flat, slowly tighten muscles on thigh of straight leg while counting out loud to __10 sec__.  Repeat _10___ times. Do __2__ sessions per day.    Straight Leg Raise: With External Leg Rotation    Lie on back with right leg straight, opposite leg bent. Rotate straight leg out and lift _10-12___ inches. Hold 5 sec Repeat _10___ times per set. Do _1-2__ sets per session. Do __2__ sessions per day.

## 2015-08-28 ENCOUNTER — Encounter: Payer: Medicare Other | Admitting: Rehabilitative and Restorative Service Providers"

## 2015-09-03 ENCOUNTER — Encounter: Payer: Medicare Other | Admitting: Physical Therapy

## 2015-09-09 ENCOUNTER — Ambulatory Visit: Payer: Medicare Other | Admitting: Sports Medicine

## 2015-09-10 ENCOUNTER — Ambulatory Visit (INDEPENDENT_AMBULATORY_CARE_PROVIDER_SITE_OTHER): Payer: Medicare Other | Admitting: Sports Medicine

## 2015-09-10 ENCOUNTER — Ambulatory Visit (INDEPENDENT_AMBULATORY_CARE_PROVIDER_SITE_OTHER): Payer: Medicare Other | Admitting: Rehabilitative and Restorative Service Providers"

## 2015-09-10 ENCOUNTER — Encounter: Payer: Self-pay | Admitting: Sports Medicine

## 2015-09-10 ENCOUNTER — Encounter: Payer: Self-pay | Admitting: Rehabilitative and Restorative Service Providers"

## 2015-09-10 VITALS — BP 119/78 | HR 72 | Resp 18 | Wt 147.9 lb

## 2015-09-10 DIAGNOSIS — M545 Low back pain, unspecified: Secondary | ICD-10-CM

## 2015-09-10 DIAGNOSIS — M5442 Lumbago with sciatica, left side: Secondary | ICD-10-CM

## 2015-09-10 DIAGNOSIS — M609 Myositis, unspecified: Secondary | ICD-10-CM

## 2015-09-10 DIAGNOSIS — M2560 Stiffness of unspecified joint, not elsewhere classified: Secondary | ICD-10-CM

## 2015-09-10 DIAGNOSIS — IMO0001 Reserved for inherently not codable concepts without codable children: Secondary | ICD-10-CM

## 2015-09-10 DIAGNOSIS — M623 Immobility syndrome (paraplegic): Secondary | ICD-10-CM | POA: Diagnosis not present

## 2015-09-10 DIAGNOSIS — M791 Myalgia: Secondary | ICD-10-CM

## 2015-09-10 DIAGNOSIS — Z7409 Other reduced mobility: Secondary | ICD-10-CM

## 2015-09-10 NOTE — Assessment & Plan Note (Signed)
Moderate improvement with formal physical therapy, still with some pain referable to the left SI joint, this was injected today and ultrasound guidance with good relief of symptoms. Continue physical therapy and return to see me in one. She did have some discogenic symptoms, so the next step would be a lumbar spine MRI if insufficient response to the sacral iliac joint injection.

## 2015-09-10 NOTE — Progress Notes (Signed)
  Subjective:    CC: follow-up back pain  HPI: Maria Li returns, she has done a month of physical therapy and feels a lot better with regards to her left-sided low back pain, I thought the pain was referable to the sacroiliac joint. There is nothing overtly radicular however she does have a bit of pain with sitting and flexion suggestive of discogenic pain.  She does have a physical therapy session coming up today, she does desire aggressive interventional treatment today. Pain is mild, improving.  Past medical history, Surgical history, Family history not pertinant except as noted below, Social history, Allergies, and medications have been entered into the medical record, reviewed, and no changes needed.   Review of Systems: No fevers, chills, night sweats, weight loss, chest pain, or shortness of breath.   Objective:    General: Well Developed, well nourished, and in no acute distress.  Neuro: Alert and oriented x3, extra-ocular muscles intact, sensation grossly intact.  HEENT: Normocephalic, atraumatic, pupils equal round reactive to light, neck supple, no masses, no lymphadenopathy, thyroid nonpalpable.  Skin: Warm and dry, no rashes. Cardiac: Regular rate and rhythm, no murmurs rubs or gallops, no lower extremity edema.  Respiratory: Clear to auscultation bilaterally. Not using accessory muscles, speaking in full sentences.  Procedure: Real-time Ultrasound Guided Injection of Left sacroiliac joint Device: GE Logiq E  Verbal informed consent obtained.  Time-out conducted.  Noted no overlying erythema, induration, or other signs of local infection.  Skin prepped in a sterile fashion.  Local anesthesia: Topical Ethyl chloride.  With sterile technique and under real time ultrasound guidance:  22-gauge spinal needle advanced into the SI joint taking care to avoid the S1 neural foramen, 1 mL kenalog 40, 2 mL lidocaine, 2 mL Marcaine injected easily. Completed without difficulty  Pain  immediately resolved suggesting accurate placement of the medication.  Advised to call if fevers/chills, erythema, induration, drainage, or persistent bleeding.  Images permanently stored and available for review in the ultrasound unit.  Impression: Technically successful ultrasound guided injection.  Impression and Recommendations:

## 2015-09-10 NOTE — Therapy (Addendum)
[place] [place] [address], [place], [address] Phone: [phone]   Fax:  [phone]  Physical Therapy Treatment  Patient Details  Name: Maria Li MRN: [id number] Date of Birth: 1951-08-03 Referring Provider: T  Encounter Date: 09/10/2015      PT End of Session - 09/10/15 1207    Visit Number 5   Number of Visits 12   Date for PT Re-Evaluation 09/22/15   PT Start Time 1115   PT Stop Time 1210   PT Time Calculation (min) 55 min   Activity Tolerance Patient tolerated treatment well      Past Medical History  Diagnosis Date  . Hypertension   . Hyperlipidemia     Past Surgical History  Procedure Laterality Date  . Tummy tuck    . Bladder suspension    . Knee surgery Right 3 months ago    There were no vitals filed for this visit.  Visit Diagnosis:  Left-sided low back pain with left-sided sciatica  Stiffness due to immobility  Impaired functional mobility and endurance      Subjective Assessment - 09/10/15 1131    Subjective Seen by Dr T this morning. He gave her an injection in her back for the pain that she continues to have. She is trying to change her habits - avoiding sitting in her bed - but does still sit like that.     Currently in Pain? No/denies            [facility] PT Assessment - 09/10/15 0001    Assessment   Medical Diagnosis Lt LBP    Referring Provider T   Onset Date/Surgical Date 05/23/15   Hand Dominance Right   Next MD Visit no return scheduled    Prior Therapy PT for Rt knee    AROM   Lumbar Extension 60%    Flexibility   ITB WFL's    Palpation   Palpation comment mild tenderness Lt lower lumbar QL/paraspinals                      [facility] Adult PT Treatment/Exercise - 09/10/15 0001    Exercises   Exercises --  gastroc/soleus stretches 30 sec x 3 each    Lumbar Exercises: Stretches   Passive Hamstring Stretch 1 rep;30 seconds   Press Ups  Limitations 2-3 sec x 10    Quad Stretch 1 rep;30 seconds   ITB Stretch 1 rep;30 seconds   Lumbar Exercises: Aerobic   Stationary Bike Nu step L5 x5'    Elliptical L2 x 6 min    Lumbar Exercises: Standing   Scapular Retraction Both;20 reps;Theraband   Theraband Level (Scapular Retraction) Level 2 (Red)   Row Both;20 reps;Theraband   Theraband Level (Row) Level 2 (Red)   Shoulder Extension Both;20 reps;Theraband   Theraband Level (Shoulder Extension) Level 2 (Red)   Lumbar Exercises: Supine   AB Set Limitations 3 part core 10 sec x 10   Bridge 10 reps   Bridge Limitations bridge with adductor squeeze 5 sec hold x 10    Other Supine Lumbar Exercises hip ext legs on orange swiss ball with core 10 sec x 10    Lumbar Exercises: Prone   Other Prone Lumbar Exercises pelvic press 5 sec x 10; alt knee flexion x10; alt leg lift x10    Lumbar Exercises: Quadruped   Opposite Arm/Leg Raise 15 reps;2 seconds   Moist Heat Therapy   Number Minutes  Moist Heat 15 Minutes   Moist Heat Location Lumbar Spine                PT Education - 09/10/15 1204    Education provided Yes   Education Details avoiding sitting propped on pillows in bed; HEP    Person(s) Educated Patient   Methods Explanation;Demonstration;Tactile cues;Verbal cues;Handout   Comprehension Verbalized understanding;Returned demonstration;Verbal cues required;Tactile cues required             PT Long Term Goals - 09/10/15 1211    PT LONG TERM GOAL #1   Title Increase trunk extension to 75-80% of range 09/02/15   Baseline 60%    Time 3   Period Weeks   Status On-going   PT LONG TERM GOAL #2   Title Pt reports that she is no longer sitting for 1-2 hours each morning propped in bed working on her computer 09/02/15   Baseline decreased time propped on pillows in bed    Time 3   Period Weeks   Status On-going   PT LONG TERM GOAL #3   Title I in HEP for discharge 09/22/15   Time 6   Period Weeks   Status On-going    PT LONG TERM GOAL #4   Title Pain free sitting for 1-2 hours 09/22/15   Time 6   Period Weeks   Status On-going   PT LONG TERM GOAL #5   Title Improve FOTO to </= 44% limitation 09/22/15   Time 6   Period Weeks   Status On-going               Plan - 09/10/15 1208    Clinical Impression Statement Patient reports that she has some intermittent LBP often after exercises. She was seen by MD today and received an injection in lumbar spine. Demonstrates improved core stability but continues to require VC for engaging core. Continues to sit propped on pillows in bed which likely irritates symptoms. Will work toward returning pt to gym program. She wil begin with walking on level surfaces at the gym. Added UE TB exercises with core work today to prepare for UE gym exercises. Progressing toward goals of therapy.    Pt will benefit from skilled therapeutic intervention in order to improve on the following deficits Improper body mechanics;Decreased mobility;Decreased endurance;Decreased activity tolerance;Pain   Rehab Potential Good   PT Frequency 2x / week   PT Duration 6 weeks   PT Treatment/Interventions Patient/family education;ADLs/Self Care Home Management;Therapeutic exercise;Therapeutic activities;Manual techniques;Dry needling;Moist Heat;Electrical Stimulation;Cryotherapy;Ultrasound   PT Next Visit Plan progress with core stabilization; add UE exercises with core work    PT Home Exercise Plan pt to avoid sitting propped in bed; HEP    Consulted and Agree with Plan of Care Patient        Problem List Patient Active Problem List   Diagnosis Date Noted  . Left low back pain 08/07/2015  . Osteoarthritis of right knee 03/28/2014  . Preventive measure 03/28/2014  . Seborrheic keratosis 03/28/2014  . Mood disorder ([place]) 03/28/2014  . Hyperlipidemia 03/28/2014  . Insomnia 03/28/2014  . Fever blister 03/28/2014     Nilda Simmer PT, MPH  09/10/2015, 12:13 PM  [facility] [place], [place], [address] Phone: [phone]   Fax:  [phone]  Name: Maria Li MRN: [id number] Date of Birth: 05/17/1951    PHYSICAL THERAPY DISCHARGE SUMMARY  Visits from Start of Care: 5  Current  functional level related to goals / functional outcomes: Good improvement in functional status and exercise tolerance   Remaining deficits: Needs to avoid sitting in bed with lumbar spine rounded!   Education / Equipment: HEP; Theraband Plan: Patient agrees to discharge.  Patient goals were partially met. Patient is being discharged due to being pleased with the current functional level.  ?????    Wilbern Pennypacker P. Helene Kelp PT, MPH 10/03/2015 1:07 PM

## 2015-09-10 NOTE — Patient Instructions (Signed)
Achilles / Soleus, Standing    Stand, right foot behind, heel on floor and turned slightly out. Lower hips and bend knees. Hold _30__ seconds. Repeat 3___ times per session. Do _2__ sessions per day.   Achilles / Gastroc, Standing    Stand, right foot behind, heel on floor and turned slightly out, leg straight, forward leg bent. Move hips forward. Hold _30__ seconds. Repeat _3_ times per session. Do _2__ sessions per day.   Push-Up, Wall    Inhaling, bend elbows and bring chest toward the wall. Exhaling, straighten arms. Repeat __10_ times. Do _2__ times per day.   Resisted External Rotation: in Neutral - Bilateral   PALMS UP Sit or stand, tubing in both hands, elbows at sides, bent to 90, forearms forward. Pinch shoulder blades together and rotate forearms out. Keep elbows at sides. Repeat __10__ times per set. Do _2-3___ sets per session. Do _2-3___ sessions per day.   Low Row: Standing   Face anchor, feet shoulder width apart. Palms up, pull arms back, squeezing shoulder blades together. Repeat 10__ times per set. Do 2-3__ sets per session. Do 2-3__ sessions per week. Anchor Height: Waist   Strengthening: Resisted Extension   Hold tubing in right hand, arm forward. Pull arm back, elbow straight. Repeat _10___ times per set. Do 2-3____ sets per session. Do 2-3____ sessions per day.

## 2015-09-12 ENCOUNTER — Encounter: Payer: Medicare Other | Admitting: Rehabilitative and Restorative Service Providers"

## 2015-09-16 ENCOUNTER — Encounter: Payer: Medicare Other | Admitting: Rehabilitative and Restorative Service Providers"

## 2015-09-18 ENCOUNTER — Encounter: Payer: Medicare Other | Admitting: Rehabilitative and Restorative Service Providers"

## 2015-09-29 DIAGNOSIS — E7800 Pure hypercholesterolemia, unspecified: Secondary | ICD-10-CM | POA: Diagnosis not present

## 2015-09-30 ENCOUNTER — Other Ambulatory Visit: Payer: Self-pay

## 2015-09-30 MED ORDER — FLUTICASONE PROPIONATE 50 MCG/ACT NA SUSP: 1.0000 | Freq: Every day | NASAL | Status: DC

## 2015-10-02 DIAGNOSIS — H18419 Arcus senilis, unspecified eye: Secondary | ICD-10-CM | POA: Diagnosis not present

## 2015-10-02 DIAGNOSIS — H04129 Dry eye syndrome of unspecified lacrimal gland: Secondary | ICD-10-CM | POA: Diagnosis not present

## 2015-10-02 DIAGNOSIS — H25093 Other age-related incipient cataract, bilateral: Secondary | ICD-10-CM | POA: Diagnosis not present

## 2015-10-02 DIAGNOSIS — H18412 Arcus senilis, left eye: Secondary | ICD-10-CM | POA: Diagnosis not present

## 2015-10-09 DIAGNOSIS — D649 Anemia, unspecified: Secondary | ICD-10-CM | POA: Diagnosis not present

## 2015-10-09 DIAGNOSIS — K146 Glossodynia: Secondary | ICD-10-CM | POA: Diagnosis not present

## 2015-12-11 DIAGNOSIS — F4323 Adjustment disorder with mixed anxiety and depressed mood: Secondary | ICD-10-CM | POA: Diagnosis not present

## 2015-12-11 DIAGNOSIS — Z6824 Body mass index (BMI) 24.0-24.9, adult: Secondary | ICD-10-CM | POA: Diagnosis not present

## 2015-12-11 DIAGNOSIS — E7800 Pure hypercholesterolemia, unspecified: Secondary | ICD-10-CM | POA: Diagnosis not present

## 2015-12-22 DIAGNOSIS — Z1231 Encounter for screening mammogram for malignant neoplasm of breast: Secondary | ICD-10-CM | POA: Diagnosis not present

## 2016-02-19 ENCOUNTER — Encounter: Payer: Self-pay | Admitting: *Deleted

## 2016-02-19 ENCOUNTER — Emergency Department (INDEPENDENT_AMBULATORY_CARE_PROVIDER_SITE_OTHER)
Admission: EM | Admit: 2016-02-19 | Discharge: 2016-02-19 | Disposition: A | Discharge status: Home / Self Care | Payer: Medicare Other | Source: Home / Self Care | Attending: Family Medicine | Admitting: Family Medicine

## 2016-02-19 DIAGNOSIS — H6993 Unspecified Eustachian tube disorder, bilateral: Secondary | ICD-10-CM

## 2016-02-19 NOTE — ED Provider Notes (Signed)
CSN: [id number]     Arrival date & time 02/19/16  1531 History   First MD Initiated Contact with Patient 02/19/16 1615     Chief Complaint  Patient presents with  . Ear Fullness  . Dizziness  . Sinus Problem      HPI Comments: Three days ago patient became dizzy while at gym, with chills and nausea.  She has been sneezing for about a week.  The history is provided by the patient.    Past Medical History  Diagnosis Date  . Hypertension   . Hyperlipidemia    Past Surgical History  Procedure Laterality Date  . Tummy tuck    . Bladder suspension    . Knee surgery Right 3 months ago   Family History  Problem Relation Age of Onset  . Heart attack Father   . Hyperlipidemia Father   . Cancer Sister     breast  . Cancer Maternal Aunt     breast  . Heart attack Paternal Uncle    Social History  Substance Use Topics  . Smoking status: Former Games developermoker  . Smokeless tobacco: None  . Alcohol Use: Yes   OB History    No data available     Review of Systems No sore throat No cough + sneezing No pleuritic pain No wheezing + nasal congestion ? post-nasal drainage + sinus pain/pressure No itchy/red eyes ? Earache + dizzy No hemoptysis No SOB No fever/chills No nausea No vomiting No abdominal pain No diarrhea No urinary symptoms No skin rash + fatigue No myalgias No  headache Used OTC meds without relief  Allergies  Review of patient's allergies indicates no known allergies.  Home Medications   Prior to Admission medications   Medication Sig Start Date End Date Taking? Authorizing Provider  atorvastatin (LIPITOR) 10 MG tablet Take 10 mg by mouth daily.   Yes Historical Provider, MD  escitalopram (LEXAPRO) 20 MG tablet Take 20 mg by mouth daily.   Yes Historical Provider, MD  fluticasone (FLONASE) 50 MCG/ACT nasal spray Place 1 spray into both nostrils daily. 09/30/15  Yes Monica Bectonhomas J Thekkekandam, MD  LORazepam (ATIVAN) 1 MG tablet Take 1 mg by mouth every 8  (eight) hours.   Yes Historical Provider, MD  meloxicam (MOBIC) 15 MG tablet One tab PO qAM with breakfast for 2 weeks, then daily prn pain. 08/07/15  Yes Monica Bectonhomas J Thekkekandam, MD  montelukast (SINGULAIR) 5 MG chewable tablet Chew 5 mg by mouth at bedtime.   Yes Historical Provider, MD  Riboflavin (B-2 PO) Take by mouth.   Yes Historical Provider, MD  Thiamine HCl (B-1 PO) Take by mouth.   Yes Historical Provider, MD  Vitamin D, Cholecalciferol, 1000 UNITS TABS Take by mouth.   Yes Historical Provider, MD  zolpidem (AMBIEN) 10 MG tablet Take 10 mg by mouth at bedtime as needed for sleep.   Yes Historical Provider, MD   Meds Ordered and Administered this Visit  Medications - No data to display  BP 144/85 mmHg  Pulse 71  Temp(Src) 98.1 F (36.7 C) (Oral)  Wt 147 lb (66.679 kg)  SpO2 100% No data found.   Physical Exam Nursing notes and Vital Signs reviewed. Appearance:  Patient appears stated age, and in no acute distress Eyes:  Pupils are equal, round, and reactive to light and accomodation.  Extraocular movement is intact.  Conjunctivae are not inflamed  Ears:  Canals normal.  Tympanic membranes normal.  Nose:  Mildly congested turbinates.  No  sinus tenderness.    Pharynx:  Normal Neck:  Supple.  Tender enlarged posterior/lateral nodes are palpated bilaterally  Lungs:  Clear to auscultation.  Breath sounds are equal.  Moving air well. Heart:  Regular rate and rhythm without murmurs, rubs, or gallops.  Abdomen:  Nontender without masses or hepatosplenomegaly.  Bowel sounds are present.  No CVA or flank tenderness.  Extremities:  No edema.  Skin:  No rash present.   ED Course  Procedures none    Labs Reviewed -   Tympanometry:  Left tympanogram wide; right tympanogram positive peak pressure I   MDM   1. Eustachian tube disorder, bilateral; suspect developing viral URI    There is no evidence of bacterial infection today.  Treat symptomatically for now  Try Afrin nasal  spray (or generic oxymetazoline) twice daily for about 5 days and then discontinue.  Also recommend using saline nasal spray several times daily and saline nasal irrigation (AYR is a common brand).  Use Flonase nasal spray each morning after using Afrin nasal spray and saline nasal irrigation.   If cold-like symptoms develop, try the following: Take plain guaifenesin (  extended release tabs such as Mucinex) twice daily, with plenty of water, for cough and congestion. Get adequate rest.   Try warm salt water gargles for sore throat.  Stop all antihistamines for now, and other non-prescription cough/cold preparations. May take Delsym Cough Suppressant at bedtime for nighttime cough.  Follow-up with family doctor if not improving about10 days.     Lattie Haw, MD 02/23/16 [phone]

## 2016-02-19 NOTE — Discharge Instructions (Signed)
Try Afrin nasal spray (or generic oxymetazoline) twice daily for about 5 days and then discontinue.  Also recommend using saline nasal spray several times daily and saline nasal irrigation (AYR is a common brand).  Use Flonase nasal spray each morning after using Afrin nasal spray and saline nasal irrigation.   If cold-like symptoms develop, try the following: Take plain guaifenesin (1200mg  extended release tabs such as Mucinex) twice daily, with plenty of water, for cough and congestion. Get adequate rest.   Try warm salt water gargles for sore throat.  Stop all antihistamines for now, and other non-prescription cough/cold preparations. May take Delsym Cough Suppressant at bedtime for nighttime cough.  Follow-up with family doctor if not improving about10 days.

## 2016-02-19 NOTE — ED Notes (Signed)
Pt c/o a BP drop 3 days ago while exercising and became diaphoretic and dizzy. Ever since, she has felt lightheaded, ear fullness and sinus pressure.

## 2016-02-23 ENCOUNTER — Ambulatory Visit: Payer: Medicare Other | Admitting: Sports Medicine

## 2016-02-24 ENCOUNTER — Ambulatory Visit: Payer: Medicare Other | Admitting: Sports Medicine

## 2016-03-15 DIAGNOSIS — R5381 Other malaise: Secondary | ICD-10-CM | POA: Diagnosis not present

## 2016-03-15 DIAGNOSIS — R5383 Other fatigue: Secondary | ICD-10-CM

## 2016-03-15 DIAGNOSIS — R42 Dizziness and giddiness: Secondary | ICD-10-CM | POA: Diagnosis not present

## 2016-03-15 DIAGNOSIS — Z6824 Body mass index (BMI) 24.0-24.9, adult: Secondary | ICD-10-CM | POA: Diagnosis not present

## 2016-03-15 DIAGNOSIS — R6889 Other general symptoms and signs: Secondary | ICD-10-CM | POA: Diagnosis not present

## 2016-03-15 HISTORY — DX: Other fatigue: R53.83

## 2016-03-15 HISTORY — DX: Other malaise: R53.81

## 2016-03-19 DIAGNOSIS — E875 Hyperkalemia: Secondary | ICD-10-CM | POA: Diagnosis not present

## 2016-03-26 DIAGNOSIS — E875 Hyperkalemia: Secondary | ICD-10-CM

## 2016-03-26 HISTORY — DX: Hyperkalemia: E87.5

## 2016-03-29 ENCOUNTER — Encounter: Payer: Self-pay | Admitting: Sports Medicine

## 2016-03-29 ENCOUNTER — Ambulatory Visit (INDEPENDENT_AMBULATORY_CARE_PROVIDER_SITE_OTHER): Payer: Medicare Other | Admitting: Sports Medicine

## 2016-03-29 VITALS — BP 129/85 | HR 79 | Resp 18 | Wt 147.0 lb

## 2016-03-29 DIAGNOSIS — R5382 Chronic fatigue, unspecified: Secondary | ICD-10-CM

## 2016-03-29 DIAGNOSIS — I9589 Other hypotension: Secondary | ICD-10-CM

## 2016-03-29 NOTE — Progress Notes (Signed)
  Subjective:    CC: syncopal episodes during exercise  HPI: 65 yo F with history of insomnia, mood disorder, presenting for evaluation of multiple presyncopal episodes when working out one month ago.  She says she was working out with a Psychologist, educational at [employer] last month when she started to feel winded and had trouble catching her breath, feels lightheaded, nauseous, and has intermittent heart palpitations.  She got down on the ground and elevated her feet until symptoms went away.  The next time she went to the gym, she had the same experience but at this time lost consciousness for a minute when she was lying on the ground trying to recover.  She says she did not hit her head during this episode.  Since then she has avoided working out because she is scared of having another syncopal episode.  She denies any history of syncope, but does say that her heart races occasionally when she is anxious.  She stays well-hydrated with water throughout the day.  She endorses a family history of heart disease - her father had a heart attack when he was in his 70s and had a total of 3 MIs over his life.  Her brother is 20 years old and has had 2 stents placed in the past year.  She denies any personal history of heart disease, but does say she saw a cardiologist years ago but can't remember what that was for.     Past medical history, Surgical history, Family history not pertinant except as noted below, Social history, Allergies, and medications have been entered into the medical record, reviewed, and no changes needed.   Review of Systems: No fevers, chills, night sweats, weight loss.  Denies chest pain.  Endorses shortness of breath and heart palpitations, as well as lightheadedness during episodes, but none currently.     Objective:    General: Well Developed, well nourished, and in no acute distress.  Neuro: Alert and oriented x3, extra-ocular muscles intact, sensation grossly intact.  HEENT: Normocephalic,  atraumatic, pupils equal round reactive to light, neck supple, no masses, no lymphadenopathy, thyroid nonpalpable.  Skin: Warm and dry, no rashes. Cardiac: Regular rate and rhythm, no murmurs rubs or gallops, no lower extremity edema.  Respiratory: Clear to auscultation bilaterally. Not using accessory muscles, speaking in full sentences.   Impression and Recommendations:  1. Syncopal/pre-syncopal episodes during exercise -Likely benign etiology of exercise induced hypotension from restarting an exercise program and not warming-up/cooling-down before high intensity exercise.  However, given family history of heart disease, will get exercise stress test to rule out coronary disease, although suspicion is low. Labs drawn at [facility] earlier this month showed K of 5.6 - will re-check today.  BMP otherwise normal.  CBC, TSH, also normal at [facility] two weeks ago.   -Will recommend warming up prior to training sessions and cooling down afterwards.  Also recommend speaking to trainers about intensity of exercises.

## 2016-03-29 NOTE — Assessment & Plan Note (Signed)
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 She does not typically warm or cool down during her intense exercise sessions which is likely contributory. Checking routine blood work, she was a bit hypokalemic with her OB/GYN. She also needs an exercise treadmill test, I do have a low suspicion for CAD.

## 2016-03-30 LAB — COMPREHENSIVE METABOLIC PANEL
ALT: 17 U/L (ref 6–29)
AST: 20 U/L (ref 10–35)
Albumin: 4.1 g/dL (ref 3.6–5.1)
Alkaline Phosphatase: 79 U/L (ref 33–130)
Calcium: 9.4 mg/dL (ref 8.6–10.4)
Total Bilirubin: 0.3 mg/dL (ref 0.2–1.2)
Total Protein: 6.5 g/dL (ref 6.1–8.1)

## 2016-03-30 LAB — CBC
HCT: 39.1 % (ref 35.0–45.0)
Hemoglobin: 12.9 g/dL (ref 11.7–15.5)
MCH: 30.4 pg (ref 27.0–33.0)
MCHC: 33 g/dL (ref 32.0–36.0)
MCV: 92 fL (ref 80.0–100.0)
MPV: 9.1 fL (ref 7.5–12.5)
Platelets: 272 K/uL (ref 140–400)
RBC: 4.25 MIL/uL (ref 3.80–5.10)
RDW: 13.1 % (ref 11.0–15.0)
WBC: 6 10*3/uL (ref 3.8–10.8)

## 2016-03-30 LAB — COMPREHENSIVE METABOLIC PANEL WITH GFR
BUN: 12 mg/dL (ref 7–25)
CO2: 26 mmol/L (ref 20–31)
Chloride: 104 mmol/L (ref 98–110)
Creat: 0.93 mg/dL (ref 0.50–0.99)
Glucose, Bld: 90 mg/dL (ref 65–99)
Potassium: 4.4 mmol/L (ref 3.5–5.3)
Sodium: 139 mmol/L (ref 135–146)

## 2016-03-30 LAB — TSH: TSH: 1.43 mIU/L

## 2016-04-14 ENCOUNTER — Ambulatory Visit (INDEPENDENT_AMBULATORY_CARE_PROVIDER_SITE_OTHER): Payer: Medicare Other

## 2016-04-14 DIAGNOSIS — I9589 Other hypotension: Secondary | ICD-10-CM | POA: Diagnosis not present

## 2016-04-14 LAB — EXERCISE TOLERANCE TEST
Estimated workload: 10.1 METS
Exercise duration (min): 9 min
Exercise duration (sec): 0 s
MPHR: 154 {beats}/min
Peak HR: 148 {beats}/min
Percent HR: 96 %
RPE: 17
Rest HR: 68 {beats}/min

## 2016-04-15 ENCOUNTER — Encounter: Payer: Self-pay | Admitting: Sports Medicine

## 2016-04-15 ENCOUNTER — Ambulatory Visit (INDEPENDENT_AMBULATORY_CARE_PROVIDER_SITE_OTHER): Payer: Medicare Other | Admitting: Sports Medicine

## 2016-04-15 VITALS — BP 126/74 | HR 64 | Resp 16 | Wt 148.0 lb

## 2016-04-15 DIAGNOSIS — M791 Myalgia: Secondary | ICD-10-CM | POA: Diagnosis not present

## 2016-04-15 DIAGNOSIS — M170 Bilateral primary osteoarthritis of knee: Secondary | ICD-10-CM

## 2016-04-15 DIAGNOSIS — E785 Hyperlipidemia, unspecified: Secondary | ICD-10-CM

## 2016-04-15 DIAGNOSIS — Z13820 Encounter for screening for osteoporosis: Secondary | ICD-10-CM

## 2016-04-15 DIAGNOSIS — M545 Low back pain, unspecified: Secondary | ICD-10-CM

## 2016-04-15 DIAGNOSIS — G4700 Insomnia, unspecified: Secondary | ICD-10-CM | POA: Diagnosis not present

## 2016-04-15 DIAGNOSIS — IMO0001 Reserved for inherently not codable concepts without codable children: Secondary | ICD-10-CM

## 2016-04-15 DIAGNOSIS — M609 Myositis, unspecified: Secondary | ICD-10-CM | POA: Diagnosis not present

## 2016-04-15 DIAGNOSIS — Z299 Encounter for prophylactic measures, unspecified: Secondary | ICD-10-CM

## 2016-04-15 MED ORDER — LORAZEPAM 2 MG PO TABS: 2.0000 mg | ORAL_TABLET | Freq: Every day | ORAL | 0 refills | Status: DC

## 2016-04-15 NOTE — Assessment & Plan Note (Signed)
Post partial meniscectomy on the right. Bilateral knee injection as above, return in one month.

## 2016-04-15 NOTE — Assessment & Plan Note (Signed)
Did moderately better with formal physical therapy, we injected her left SI joint in January and she had a good response until recently. Repeat left SI joint injection as above, she did have some discogenic-type symptoms so the next step with still be a lumbar spine MRI for epidural planning if insufficient response to this injection.

## 2016-04-15 NOTE — Progress Notes (Signed)
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 She is now having a recurrence of back pain, same location and desires repeat interventional treatment today.  Bilateral knee pain: Minimal osteoarthritis, post partial meniscectomy on the right, now having recurrence of pain, moderate, persistent, localized at the medial joint line on both sides without mechanical symptoms or swelling.  She also desires a refill of Ativan for her insomnia.  Past medical history, Surgical history, Family history not pertinant except as noted below, Social history, Allergies, and medications have been entered into the medical record, reviewed, and no changes needed.   Review of Systems: No fevers, chills, night sweats, weight loss, chest pain, or shortness of breath.   Objective:    General: Well Developed, well nourished, and in no acute distress.  Neuro: Alert and oriented x3, extra-ocular muscles intact, sensation grossly intact.  HEENT: Normocephalic, atraumatic, pupils equal round reactive to light, neck supple, no masses, no lymphadenopathy, thyroid nonpalpable.  Skin: Warm and dry, no rashes. Cardiac: Regular rate and rhythm, no murmurs rubs or gallops, no lower extremity edema.  Respiratory: Clear to auscultation bilaterally. Not using accessory muscles, speaking in full sentences.  Procedure: Real-time Ultrasound Guided Injection of left knee Device: GE Logiq E  Verbal informed consent obtained.  Time-out conducted.  Noted no overlying erythema, induration, or other signs of local infection.  Skin prepped in a sterile fashion.  Local anesthesia: Topical Ethyl chloride.  With sterile technique and under real time ultrasound guidance:  1 mL Kenalog 40, 2 mL lidocaine, 2 mL Marcaine injected easily into the suprapatellar recess. Completed without  difficulty  Pain immediately resolved suggesting accurate placement of the medication.  Advised to call if fevers/chills, erythema, induration, drainage, or persistent bleeding.  Images permanently stored and available for review in the ultrasound unit.  Impression: Technically successful ultrasound guided injection.  Procedure: Real-time Ultrasound Guided Injection of right knee Device: GE Logiq E  Verbal informed consent obtained.  Time-out conducted.  Noted no overlying erythema, induration, or other signs of local infection.  Skin prepped in a sterile fashion.  Local anesthesia: Topical Ethyl chloride.  With sterile technique and under real time ultrasound guidance:  1 mL Kenalog 40, 2 mL lidocaine, 2 mL Marcaine injected easily into the suprapatellar recess. Completed without difficulty  Pain immediately resolved suggesting accurate placement of the medication.  Advised to call if fevers/chills, erythema, induration, drainage, or persistent bleeding.  Images permanently stored and available for review in the ultrasound unit.  Impression: Technically successful ultrasound guided injection.  Procedure: Real-time Ultrasound Guided Injection of left SI joint Device: GE Logiq E  Verbal informed consent obtained.  Time-out conducted.  Noted no overlying erythema, induration, or other signs of local infection.  Skin prepped in a sterile fashion.  Local anesthesia: Topical Ethyl chloride.  With sterile technique and under real time ultrasound guidance:  Using a 22-gauge spinal needle advanced towards the SI joint and taking care to avoid penetration of the S1 foramen I entered the joint easily and 1 mL kenalog 40, 2 mL lidocaine, 2 mL Marcaine were injected easily. Completed without difficulty  Pain immediately resolved suggesting accurate placement of the medication.  Advised to call if fevers/chills, erythema, induration, drainage, or persistent bleeding.  Images permanently stored and  available for review in the ultrasound unit.  Impression: Technically successful ultrasound guided injection.  Impression and Recommendations:    Primary osteoarthritis of both knees Post partial meniscectomy on the right. Bilateral knee injection as above, return in one month.  Left low back pain Did moderately better with formal physical therapy, we injected her left SI joint in January and she had a good response until recently. Repeat left SI joint injection as above, she did have some discogenic-type symptoms so the next step with still be a lumbar spine MRI for epidural planning if insufficient response to this injection.  Insomnia Refilling lorazepam, 2 mg daily at bedtime  Preventive measure Patient will return in September 2017 for Medicare physical. At that point we'll obtain lab work, bone density test. I'm also going to set her up with OB/GYN down the hall.

## 2016-04-15 NOTE — Assessment & Plan Note (Signed)
Patient will return in September 2017 for Medicare physical. At that point we'll obtain lab work, bone density test. I'm also going to set her up with OB/GYN down the hall.

## 2016-04-15 NOTE — Assessment & Plan Note (Signed)
Refilling lorazepam, 2 mg daily at bedtime

## 2016-05-13 ENCOUNTER — Ambulatory Visit: Payer: Medicare Other | Admitting: Sports Medicine

## 2016-05-17 ENCOUNTER — Ambulatory Visit (INDEPENDENT_AMBULATORY_CARE_PROVIDER_SITE_OTHER): Payer: Medicare Other | Admitting: Sports Medicine

## 2016-05-17 DIAGNOSIS — M170 Bilateral primary osteoarthritis of knee: Secondary | ICD-10-CM

## 2016-05-17 DIAGNOSIS — L821 Other seborrheic keratosis: Secondary | ICD-10-CM

## 2016-05-17 DIAGNOSIS — M545 Low back pain, unspecified: Secondary | ICD-10-CM

## 2016-05-17 MED ORDER — TRIAMCINOLONE ACETONIDE 0.5 % EX CREA: 1.0000 "application " | TOPICAL_CREAM | Freq: Two times a day (BID) | CUTANEOUS | 3 refills | Status: DC

## 2016-05-17 NOTE — Assessment & Plan Note (Signed)
Having some widespread for pruritus Adding triamcinolone cream.

## 2016-05-17 NOTE — Assessment & Plan Note (Signed)
Persistent. SI joint injection did not provide good relief. Pain is axial and discogenic without a radicular component, we are going to proceed with MRI for interventional planning.  She did complain of some dysgeusis recently but she did have a sinus infection and has been given some nasal saline with urgent care.

## 2016-05-17 NOTE — Progress Notes (Signed)
  Subjective:    CC: Follow-up  HPI: Bilateral knee pain: Improved after bilateral injections  Low back pain: Axial, left-sided, discogenic, she had SI joint injections by another provider in the past, we did one at the last visit and she did not get much of response. Agreeable to proceed with MRI. Pain does not have a radicular component, no bowel or bladder dysfunction, saddle numbness or constitutional symptoms.  Itching: Also associated with loss of taste, on further questioning she did have a sinus infection recently. Has been using nasal saline.  Past medical history, Surgical history, Family history not pertinant except as noted below, Social history, Allergies, and medications have been entered into the medical record, reviewed, and no changes needed.   Review of Systems: No fevers, chills, night sweats, weight loss, chest pain, or shortness of breath.   Objective:    General: Well Developed, well nourished, and in no acute distress.  Neuro: Alert and oriented x3, extra-ocular muscles intact, sensation grossly intact.  HEENT: Normocephalic, atraumatic, pupils equal round reactive to light, neck supple, no masses, no lymphadenopathy, thyroid nonpalpable.  Skin: Warm and dry, no rashes. Cardiac: Regular rate and rhythm, no murmurs rubs or gallops, no lower extremity edema.  Respiratory: Clear to auscultation bilaterally. Not using accessory muscles, speaking in full sentences.  Impression and Recommendations:    Primary osteoarthritis of both knees Improved with steroid injections at the last visit.  Left low back pain Persistent. SI joint injection did not provide good relief. Pain is axial and discogenic without a radicular component, we are going to proceed with MRI for interventional planning.  She did complain of some dysgeusis recently but she did have a sinus infection and has been given some nasal saline with urgent care.  Seborrheic keratosis Having some widespread  for pruritus Adding triamcinolone cream.  I spent 25 minutes with this patient, greater than 50% was face-to-face time counseling regarding the above diagnoses

## 2016-05-17 NOTE — Assessment & Plan Note (Signed)
Improved with steroid injections at the last visit.

## 2016-07-01 DIAGNOSIS — R87810 Cervical high risk human papillomavirus (HPV) DNA test positive: Secondary | ICD-10-CM | POA: Diagnosis not present

## 2016-07-01 DIAGNOSIS — Z1151 Encounter for screening for human papillomavirus (HPV): Secondary | ICD-10-CM | POA: Diagnosis not present

## 2016-07-01 DIAGNOSIS — Z01411 Encounter for gynecological examination (general) (routine) with abnormal findings: Secondary | ICD-10-CM | POA: Diagnosis not present

## 2016-07-01 DIAGNOSIS — Z124 Encounter for screening for malignant neoplasm of cervix: Secondary | ICD-10-CM | POA: Diagnosis not present

## 2016-07-06 DIAGNOSIS — R87810 Cervical high risk human papillomavirus (HPV) DNA test positive: Secondary | ICD-10-CM | POA: Insufficient documentation

## 2016-07-06 DIAGNOSIS — R87610 Atypical squamous cells of undetermined significance on cytologic smear of cervix (ASC-US): Secondary | ICD-10-CM

## 2016-07-06 HISTORY — DX: Atypical squamous cells of undetermined significance on cytologic smear of cervix (ASC-US): R87.610

## 2016-07-20 ENCOUNTER — Encounter: Payer: Self-pay | Admitting: Sports Medicine

## 2016-07-20 ENCOUNTER — Ambulatory Visit (INDEPENDENT_AMBULATORY_CARE_PROVIDER_SITE_OTHER): Payer: Medicare Other | Admitting: Sports Medicine

## 2016-07-20 DIAGNOSIS — N10 Acute tubulo-interstitial nephritis: Secondary | ICD-10-CM | POA: Diagnosis not present

## 2016-07-20 DIAGNOSIS — N390 Urinary tract infection, site not specified: Secondary | ICD-10-CM | POA: Insufficient documentation

## 2016-07-20 LAB — POCT URINALYSIS DIPSTICK
Bilirubin, UA: NEGATIVE
Blood, UA: NEGATIVE
Glucose, UA: NEGATIVE
Ketones, UA: NEGATIVE
Leukocytes, UA: NEGATIVE
Nitrite, UA: NEGATIVE
Protein, UA: NEGATIVE
Spec Grav, UA: 1.015
Urobilinogen, UA: 0.2
pH, UA: 5

## 2016-07-20 MED ORDER — NITROFURANTOIN MONOHYD MACRO 100 MG PO CAPS: 100.0000 mg | ORAL_CAPSULE | Freq: Two times a day (BID) | ORAL | 0 refills | Status: DC

## 2016-07-20 NOTE — Assessment & Plan Note (Signed)
With acute flank pain, clear urinalysis. Macrobid, urine culture. Return to see me as needed.

## 2016-07-20 NOTE — Progress Notes (Signed)
  Subjective:    CC: Urinary frequency  HPI: This is a pleasant 65 year old female, she is post bladder sling procedure and does not typically leak with Valsalva, for the past several days she's had increasing odor and frequency, without dysuria, no discharge, she does have a bit of right flank pain, no constitutional symptoms, symptoms are mild, persistent.  Past medical history:  Negative.  See flowsheet/record as well for more information.  Surgical history: Negative.  See flowsheet/record as well for more information.  Family history: Negative.  See flowsheet/record as well for more information.  Social history: Negative.  See flowsheet/record as well for more information.  Allergies, and medications have been entered into the medical record, reviewed, and no changes needed.   Review of Systems: No fevers, chills, night sweats, weight loss, chest pain, or shortness of breath.   Objective:    General: Well Developed, well nourished, and in no acute distress.  Neuro: Alert and oriented x3, extra-ocular muscles intact, sensation grossly intact.  HEENT: Normocephalic, atraumatic, pupils equal round reactive to light, neck supple, no masses, no lymphadenopathy, thyroid nonpalpable.  Skin: Warm and dry, no rashes. Cardiac: Regular rate and rhythm, no murmurs rubs or gallops, no lower extremity edema.  Respiratory: Clear to auscultation bilaterally. Not using accessory muscles, speaking in full sentences. Abdomen: Soft, nontender, nondistended, normal bowel sounds, palpable masses, no guarding, rigidity, rebound tenderness.  Urinalysis is negative. Awaiting urine culture.  Impression and Recommendations:    Infection of urinary tract With acute flank pain, clear urinalysis. Macrobid, urine culture. Return to see me as needed.

## 2016-07-22 LAB — URINE CULTURE: Organism ID, Bacteria: NO GROWTH

## 2016-07-26 ENCOUNTER — Encounter: Payer: Self-pay | Admitting: Sports Medicine

## 2016-07-26 DIAGNOSIS — L821 Other seborrheic keratosis: Secondary | ICD-10-CM | POA: Diagnosis not present

## 2016-07-26 DIAGNOSIS — L570 Actinic keratosis: Secondary | ICD-10-CM | POA: Diagnosis not present

## 2016-08-03 DIAGNOSIS — R87610 Atypical squamous cells of undetermined significance on cytologic smear of cervix (ASC-US): Secondary | ICD-10-CM | POA: Diagnosis not present

## 2016-08-03 DIAGNOSIS — R87810 Cervical high risk human papillomavirus (HPV) DNA test positive: Secondary | ICD-10-CM | POA: Diagnosis not present

## 2016-09-25 IMAGING — CR DG LUMBAR SPINE COMPLETE 4+V
5 series · 5 of 5 positions shown · non-contrast
Comparison: None.

CLINICAL DATA: Left-sided low back pain for 3 months.

EXAM:
LUMBAR SPINE - COMPLETE 4+ VIEW

[l-spine ap]
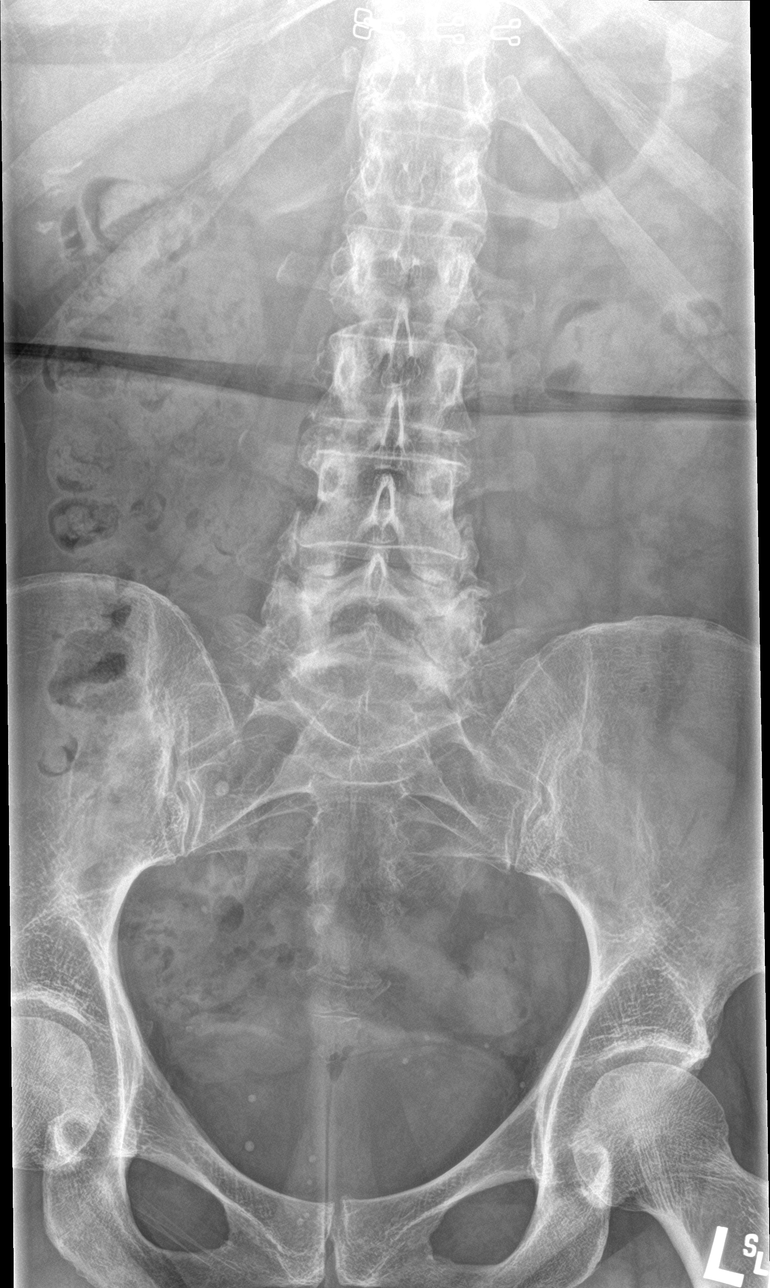

[l-spine obl (1 of 2)]
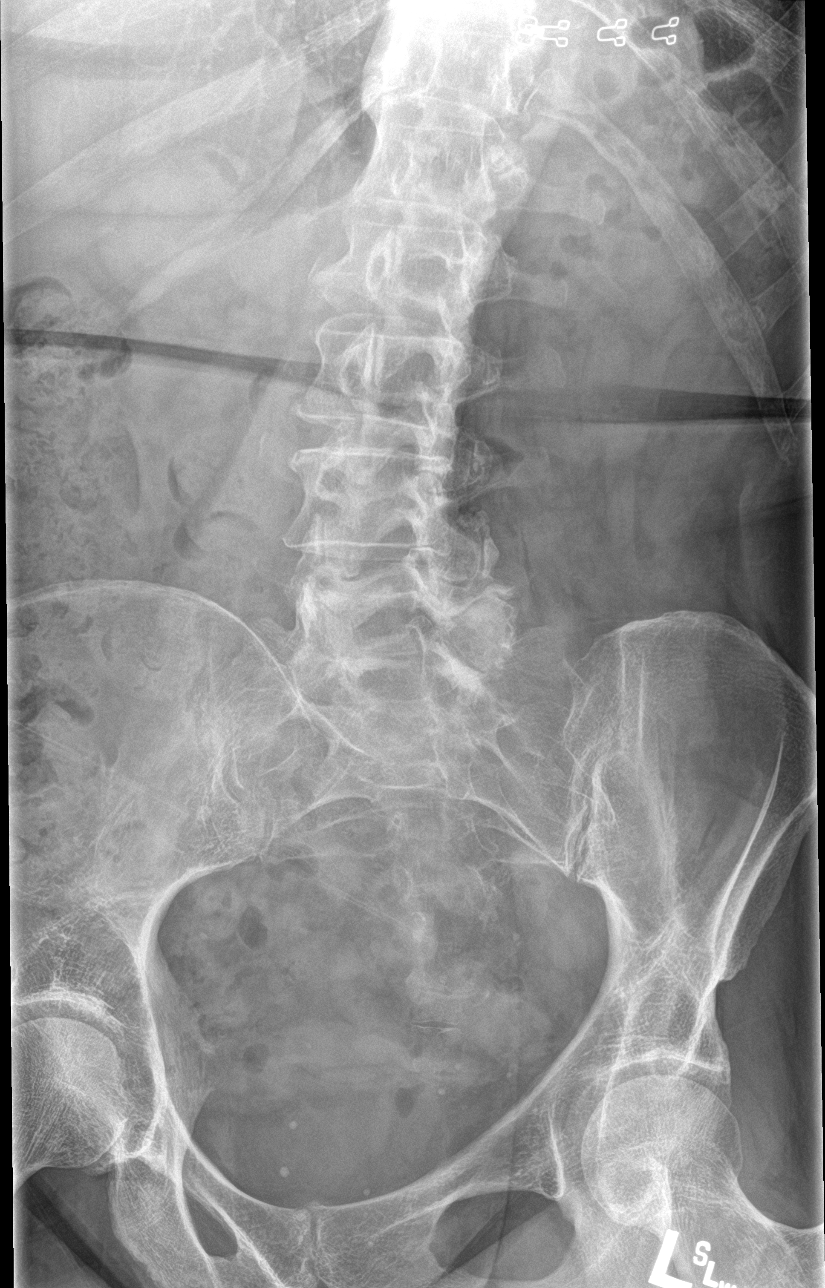

[l-spine obl (2 of 2)]
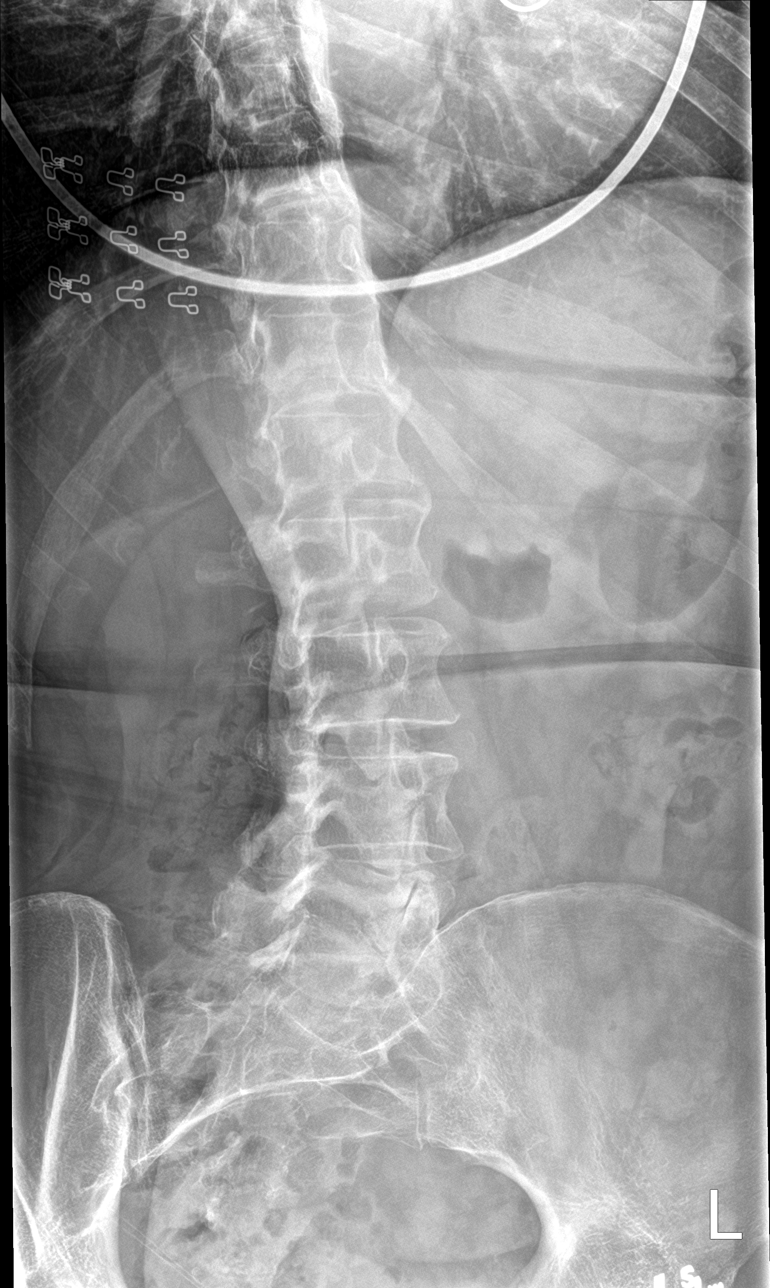

[l-spine lat]
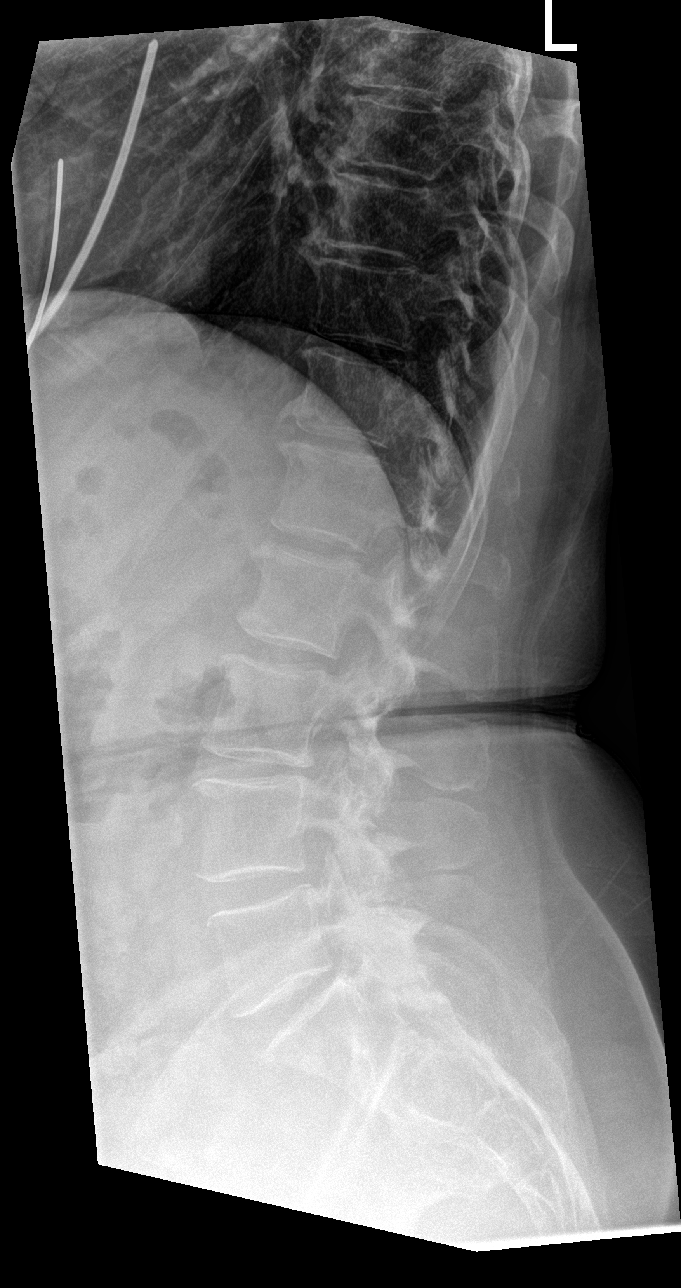

[l-spine spot]
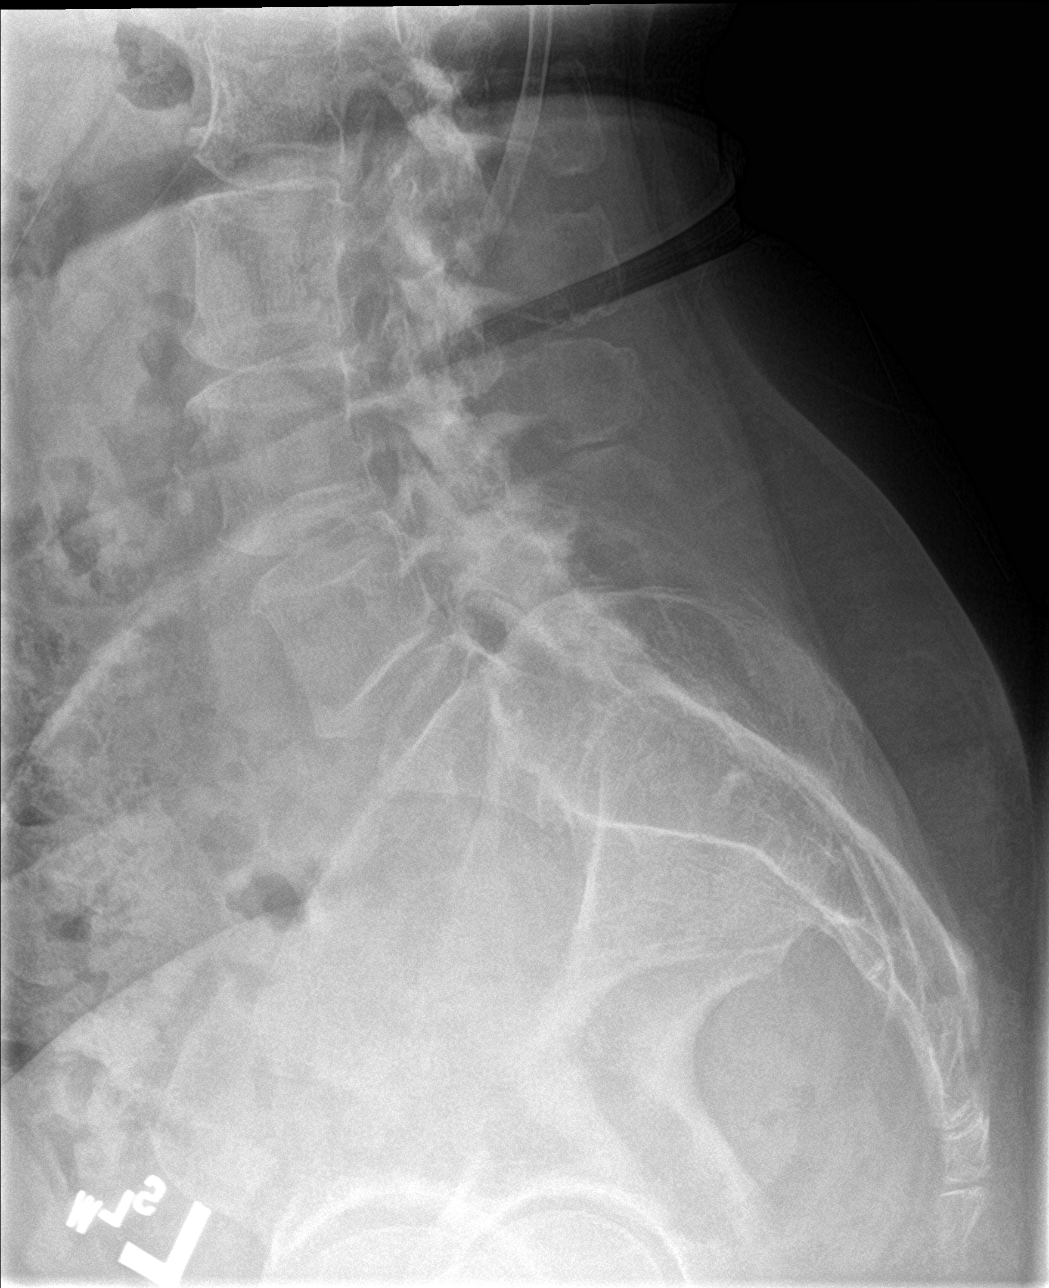

[5 of 5 positions shown; findings below may reference images not displayed]

FINDINGS: There is anatomic alignment no vertebral compression deformity.
Osteopenia. Anterior osteophyte formation in the upper lumbar and
lower thoracic spine is present. Disc height is relatively
maintained. There is marked facet arthropathy throughout the lower
lumbar spine. The lumbosacral spine is transitional. Complete spine
imaging is required to make accurate labeling
IMPRESSION: No acute bony pathology.  Chronic changes.

## 2016-10-26 ENCOUNTER — Ambulatory Visit (INDEPENDENT_AMBULATORY_CARE_PROVIDER_SITE_OTHER): Payer: Medicare Other | Admitting: Sports Medicine

## 2016-10-26 ENCOUNTER — Encounter: Payer: Self-pay | Admitting: Sports Medicine

## 2016-10-26 DIAGNOSIS — G8929 Other chronic pain: Secondary | ICD-10-CM

## 2016-10-26 DIAGNOSIS — M545 Low back pain: Secondary | ICD-10-CM | POA: Diagnosis not present

## 2016-10-26 DIAGNOSIS — M170 Bilateral primary osteoarthritis of knee: Secondary | ICD-10-CM | POA: Diagnosis not present

## 2016-10-26 DIAGNOSIS — G4700 Insomnia, unspecified: Secondary | ICD-10-CM

## 2016-10-26 MED ORDER — LORAZEPAM 2 MG PO TABS: 2.0000 mg | ORAL_TABLET | Freq: Every day | ORAL | 0 refills | Status: DC

## 2016-10-26 NOTE — Progress Notes (Signed)
  Subjective:    CC: Multiple issues  HPI: This is a 66 year old female, she comes in needing a refill on her anxiety medication.  Bilateral knee osteoarthritis: Previous injection was many months ago, desires repeat injection, having recurrence of pain, moderate, persistent, localized to the joint lines without radiation, no mechanical symptoms.  Low back pain: Didnt respond to a sacroiliac joint injection, she never followed through with her MRI.  Past medical history:  Negative.  See flowsheet/record as well for more information.  Surgical history: Negative.  See flowsheet/record as well for more information.  Family history: Negative.  See flowsheet/record as well for more information.  Social history: Negative.  See flowsheet/record as well for more information.  Allergies, and medications have been entered into the medical record, reviewed, and no changes needed.   Review of Systems: No fevers, chills, night sweats, weight loss, chest pain, or shortness of breath.   Objective:    General: Well Developed, well nourished, and in no acute distress.  Neuro: Alert and oriented x3, extra-ocular muscles intact, sensation grossly intact.  HEENT: Normocephalic, atraumatic, pupils equal round reactive to light, neck supple, no masses, no lymphadenopathy, thyroid nonpalpable.  Skin: Warm and dry, no rashes. Cardiac: Regular rate and rhythm, no murmurs rubs or gallops, no lower extremity edema.  Respiratory: Clear to auscultation bilaterally. Not using accessory muscles, speaking in full sentences.  Procedure: Real-time Ultrasound Guided Injection of left knee Device: GE Logiq E  Verbal informed consent obtained.  Time-out conducted.  Noted no overlying erythema, induration, or other signs of local infection.  Skin prepped in a sterile fashion.  Local anesthesia: Topical Ethyl chloride.  With sterile technique and under real time ultrasound guidance:  1 mL Kenalog 40, 2 mL lidocaine, 2  mL Marcaine injected easily Completed without difficulty  Pain immediately resolved suggesting accurate placement of the medication.  Advised to call if fevers/chills, erythema, induration, drainage, or persistent bleeding.  Images permanently stored and available for review in the ultrasound unit.  Impression: Technically successful ultrasound guided injection.  Procedure: Real-time Ultrasound Guided Injection of right knee Device: GE Logiq E  Verbal informed consent obtained.  Time-out conducted.  Noted no overlying erythema, induration, or other signs of local infection.  Skin prepped in a sterile fashion.  Local anesthesia: Topical Ethyl chloride.  With sterile technique and under real time ultrasound guidance:  1 mL kenalog 40, 2 mL lidocaine, 2 mL Marcaine injected easily Completed without difficulty  Pain immediately resolved suggesting accurate placement of the medication.  Advised to call if fevers/chills, erythema, induration, drainage, or persistent bleeding.  Images permanently stored and available for review in the ultrasound unit.  Impression: Technically successful ultrasound guided injection.  Impression and Recommendations:    Primary osteoarthritis of both knees Repeat injections today, the previous injections provided approximately 5 months of relief.  Left low back pain Persistent low back pain, SI joint injection did not provide good relief. Pain is predominantly axial, there is a discogenic nature to it, there is nothing radicular. She was supposed to get an MRI of the lumbar spine and never followed through with this. Advised would not proceed until she has gotten her MRI.  I spent 25 minutes with this patient, greater than 50% was face-to-face time counseling regarding the above diagnoses, this was separate from the time spent performing the above procedures.

## 2016-10-26 NOTE — Assessment & Plan Note (Signed)
Repeat injections today, the previous injections provided approximately 5 months of relief.

## 2016-10-26 NOTE — Assessment & Plan Note (Signed)
Persistent low back pain, SI joint injection did not provide good relief. Pain is predominantly axial, there is a discogenic nature to it, there is nothing radicular. She was supposed to get an MRI of the lumbar spine and never followed through with this. Advised would not proceed until she has gotten her MRI.

## 2016-10-28 ENCOUNTER — Ambulatory Visit: Payer: Medicare Other | Admitting: Sports Medicine

## 2016-10-29 ENCOUNTER — Telehealth: Payer: Self-pay

## 2016-10-29 NOTE — Telephone Encounter (Signed)
Maria Li called and reports she still has productive cough and head congestion. Denies fever, chills or sweats. Refused appointment.

## 2016-10-29 NOTE — Telephone Encounter (Signed)
Use over-the-counter TheraFlu every 8 hours and stay ahead of the symptoms. TheraFlu has a antipyretic, painkiller, decongestant, and a cough suppressant.

## 2016-10-29 NOTE — Telephone Encounter (Signed)
Patient advised.

## 2016-11-01 ENCOUNTER — Other Ambulatory Visit: Payer: Medicare Other

## 2016-11-02 ENCOUNTER — Encounter: Payer: Self-pay | Admitting: Sports Medicine

## 2016-11-02 ENCOUNTER — Ambulatory Visit (INDEPENDENT_AMBULATORY_CARE_PROVIDER_SITE_OTHER): Payer: Medicare Other

## 2016-11-02 ENCOUNTER — Ambulatory Visit (INDEPENDENT_AMBULATORY_CARE_PROVIDER_SITE_OTHER): Payer: Medicare Other | Admitting: Sports Medicine

## 2016-11-02 ENCOUNTER — Telehealth: Payer: Self-pay | Admitting: Sports Medicine

## 2016-11-02 DIAGNOSIS — R05 Cough: Secondary | ICD-10-CM | POA: Diagnosis not present

## 2016-11-02 DIAGNOSIS — M170 Bilateral primary osteoarthritis of knee: Secondary | ICD-10-CM | POA: Diagnosis not present

## 2016-11-02 DIAGNOSIS — J209 Acute bronchitis, unspecified: Secondary | ICD-10-CM | POA: Insufficient documentation

## 2016-11-02 DIAGNOSIS — J208 Acute bronchitis due to other specified organisms: Secondary | ICD-10-CM | POA: Diagnosis not present

## 2016-11-02 MED ORDER — PREDNISONE 50 MG PO TABS: 50.0000 mg | ORAL_TABLET | Freq: Every day | ORAL | 0 refills | Status: DC

## 2016-11-02 MED ORDER — HYDROCOD POLST-CPM POLST ER 10-8 MG/5ML PO SUER: 5.0000 mL | Freq: Two times a day (BID) | ORAL | 0 refills | Status: DC | PRN

## 2016-11-02 NOTE — Progress Notes (Signed)
  Subjective:    CC: Follow-up  HPI: Knee osteoarthritis: Right side did well after steroid injection a week ago, left knee still hurts, agreeable to proceed with Orthovisc on the left.  Cough: Persistent now for 4 days, nonproductive, mildly run down, mild dizziness and fullness in her ears. Symptoms are mild, persistent, no shortness of breath or chest pain.  Past medical history:  Negative.  See flowsheet/record as well for more information.  Surgical history: Negative.  See flowsheet/record as well for more information.  Family history: Negative.  See flowsheet/record as well for more information.  Social history: Negative.  See flowsheet/record as well for more information.  Allergies, and medications have been entered into the medical record, reviewed, and no changes needed.   Review of Systems: No fevers, chills, night sweats, weight loss, chest pain, or shortness of breath.   Objective:    General: Well Developed, well nourished, and in no acute distress.  Neuro: Alert and oriented x3, extra-ocular muscles intact, sensation grossly intact.  HEENT: Normocephalic, atraumatic, pupils equal round reactive to light, neck supple, no masses, no lymphadenopathy, thyroid nonpalpable. Oropharynx, nasopharynx, ear canals unremarkable. Skin: Warm and dry, no rashes. Cardiac: Regular rate and rhythm, no murmurs rubs or gallops, no lower extremity edema.  Respiratory: Siegel crackle in the left lower lobe, otherwise clear. Not using accessory muscles, speaking in full sentences.  Impression and Recommendations:    Acute bronchitis due to infection Tussionex, prednisone, also has some eustachian tube dysfunction. She did have a single crackle in the left lower lobe so I am going to get a chest x-ray. May use Delsym during the day for her cough, cautioned that this could take 3 or 4 weeks to resolve.  Primary osteoarthritis of both knees Previous injection was last week, right knee is doing  well, left knee still with pain, I'm going to set her up for viscous supplementation on the left knee.

## 2016-11-02 NOTE — Telephone Encounter (Signed)
-----   Message from Monica Bectonhomas J Thekkekandam, MD sent at 11/02/2016  2:36 PM EST ----- Orthovisc approval left knee only, x-ray confirmed, failed steroid injections. ___________________________________________ Ihor Austinhomas J. Benjamin Stainhekkekandam, M.D., ABFM., CAQSM. Primary Care and Sports Medicine [facility]  Adjunct Instructor of Family Medicine  [employer]

## 2016-11-02 NOTE — Telephone Encounter (Signed)
Submitted for approval on Orthovisc. Awaiting confirmation.  

## 2016-11-02 NOTE — Assessment & Plan Note (Signed)
Tussionex, prednisone, also has some eustachian tube dysfunction. She did have a single crackle in the left lower lobe so I am going to get a chest x-ray. May use Delsym during the day for her cough, cautioned that this could take 3 or 4 weeks to resolve.

## 2016-11-02 NOTE — Assessment & Plan Note (Signed)
Previous injection was last week, right knee is doing well, left knee still with pain, I'm going to set her up for viscous supplementation on the left knee.

## 2016-11-15 ENCOUNTER — Other Ambulatory Visit: Payer: Medicare Other

## 2016-12-21 NOTE — Telephone Encounter (Signed)
Received the following information from OV benefits investigation: No auth required. Physician may buy and bill.   Left info on Pt's VM to be scheduled.

## 2017-01-12 ENCOUNTER — Emergency Department (INDEPENDENT_AMBULATORY_CARE_PROVIDER_SITE_OTHER)
Admission: EM | Admit: 2017-01-12 | Discharge: 2017-01-12 | Disposition: A | Discharge status: Home / Self Care | Payer: Medicare Other | Source: Home / Self Care | Attending: Family Medicine | Admitting: Family Medicine

## 2017-01-12 DIAGNOSIS — R300 Dysuria: Secondary | ICD-10-CM

## 2017-01-12 DIAGNOSIS — R350 Frequency of micturition: Secondary | ICD-10-CM

## 2017-01-12 LAB — POCT URINALYSIS DIP (MANUAL ENTRY)
Bilirubin, UA: NEGATIVE
Blood, UA: NEGATIVE
Glucose, UA: NEGATIVE mg/dL
Ketones, POC UA: NEGATIVE mg/dL
Leukocytes, UA: NEGATIVE
Nitrite, UA: NEGATIVE
Protein Ur, POC: NEGATIVE mg/dL
Spec Grav, UA: 1.015 (ref 1.010–1.025)
Urobilinogen, UA: 0.2 E.U./dL
pH, UA: 6 (ref 5.0–8.0)

## 2017-01-12 NOTE — ED Triage Notes (Signed)
Pt has been having urinary frequency, and lower back pain.  Seems tired and fatigue.

## 2017-01-12 NOTE — ED Provider Notes (Signed)
CSN: [id number]     Arrival date & time 01/12/17  1333 History   First MD Initiated Contact with Patient 01/12/17 1408     Chief Complaint  Patient presents with  . Urinary Frequency   (Consider location/radiation/quality/duration/timing/severity/associated sxs/prior Treatment) HPI Maria Li is a 66 y.o. female presenting to UC with c/o urinary frequency and lower back pain and mild fatigue for about 2-3 days. Pt notes she has been doing a lot of traveling recently so she is unsure if symptoms are due to recent travel or from a UTI. Hx of UTI a few months ago.  Denies fever, chills, n/v/d. She has not tried anything OTC for her symptoms.    Past Medical History:  Diagnosis Date  . Hyperlipidemia   . Hypertension    Past Surgical History:  Procedure Laterality Date  . BLADDER SUSPENSION    . KNEE SURGERY Right 3 months ago  . tummy tuck     Family History  Problem Relation Age of Onset  . Heart attack Father   . Hyperlipidemia Father   . Cancer Sister     breast  . Cancer Maternal Aunt     breast  . Heart attack Paternal Uncle    Social History  Substance Use Topics  . Smoking status: Former Games developermoker  . Smokeless tobacco: Never Used  . Alcohol use Yes   OB History    No data available     Review of Systems  Constitutional: Positive for fatigue. Negative for chills and fever.  Cardiovascular: Negative for chest pain and palpitations.  Gastrointestinal: Negative for abdominal pain, diarrhea, nausea and vomiting.  Genitourinary: Positive for frequency. Negative for dysuria.  Musculoskeletal: Positive for back pain (lower). Negative for arthralgias and myalgias.  Skin: Negative for rash.    Allergies  Patient has no known allergies.  Home Medications   Prior to Admission medications   Medication Sig Start Date End Date Taking? Authorizing Provider  atorvastatin (LIPITOR) 10 MG tablet Take 10 mg by mouth daily.    [provider]   chlorpheniramine-HYDROcodone (TUSSIONEX) 10-8 MG/5ML SUER Take 5 mLs by mouth every 12 (twelve) hours as needed for cough (cough, will cause drowsiness.). 11/02/16   Monica Bectonhekkekandam, Thomas J, MD  escitalopram (LEXAPRO) 20 MG tablet Take 10 mg by mouth daily.     [provider]  fluticasone (FLONASE) 50 MCG/ACT nasal spray Place 1 spray into both nostrils daily. 09/30/15   Monica Bectonhekkekandam, Thomas J, MD  LORazepam (ATIVAN) 1 MG tablet  02/19/16   [provider]  montelukast (SINGULAIR) 5 MG chewable tablet Chew 5 mg by mouth at bedtime.    [provider]  nitrofurantoin, macrocrystal-monohydrate, (MACROBID) 100 MG capsule Take 1 capsule (100 mg total) by mouth 2 (two) times daily. 07/20/16   Monica Bectonhekkekandam, Thomas J, MD  predniSONE (DELTASONE) 50 MG tablet Take 1 tablet (50 mg total) by mouth daily. 11/02/16   Monica Bectonhekkekandam, Thomas J, MD  Riboflavin (B-2 PO) Take by mouth.    [provider]  Thiamine HCl (B-1 PO) Take by mouth.    [provider]  triamcinolone cream (KENALOG) 0.5 % Apply 1 application topically 2 (two) times daily. To affected areas. 05/17/16   Monica Bectonhekkekandam, Thomas J, MD  Vitamin D, Cholecalciferol, 1000 UNITS TABS Take by mouth.    [provider]   Meds Ordered and Administered this Visit  Medications - No data to display  BP 127/77 (BP Location: Left Arm)   Pulse 70  Temp 97.8 F (36.6 C) (Oral)   Ht 5' 5.5" (1.664 m)   Wt 140 lb (63.5 kg)   SpO2 97%   BMI 22.94 kg/m  No data found.   Physical Exam  Constitutional: She is oriented to person, place, and time. She appears well-developed and well-nourished. No distress.  HENT:  Head: Normocephalic and atraumatic.  Mouth/Throat: Oropharynx is clear and moist.  Eyes: EOM are normal.  Neck: Normal range of motion.  Cardiovascular: Normal rate and regular rhythm.   Pulmonary/Chest: Effort normal and breath sounds normal. No respiratory distress. She has no wheezes. She has  no rales.  Abdominal: Soft. She exhibits no distension. There is no tenderness. There is no guarding and no CVA tenderness.  Musculoskeletal: Normal range of motion.  Neurological: She is alert and oriented to person, place, and time.  Skin: Skin is warm and dry. She is not diaphoretic.  Psychiatric: She has a normal mood and affect. Her behavior is normal.  Nursing note and vitals reviewed.   Urgent Care Course     Procedures (including critical care time)  Labs Review Labs Reviewed  URINE CULTURE  POCT URINALYSIS DIP (MANUAL ENTRY)    Imaging Review No results found.    MDM   1. Urinary frequency    UA: WNL  Will send culture. Encouraged good hydration. f/u with PCP as needed next week if not improving.    Junius Finner, PA-C 01/12/17 1525

## 2017-01-13 LAB — URINE CULTURE: Organism ID, Bacteria: NO GROWTH

## 2017-02-14 ENCOUNTER — Encounter: Payer: Self-pay | Admitting: Sports Medicine

## 2017-02-14 ENCOUNTER — Ambulatory Visit (INDEPENDENT_AMBULATORY_CARE_PROVIDER_SITE_OTHER): Payer: Medicare Other | Admitting: Sports Medicine

## 2017-02-14 DIAGNOSIS — M170 Bilateral primary osteoarthritis of knee: Secondary | ICD-10-CM

## 2017-02-14 NOTE — Progress Notes (Signed)
  Subjective:    CC: Follow-up  HPI: Bilateral knee pain: Last injection was a long time ago, she did get approved for Orthovisc but today she only wants to do a steroid injection because she has a trip coming [place] to [place]. Pain is moderate, persistent, localized at the joint lines without radiation or mechanical symptoms.  Past medical history:  Negative.  See flowsheet/record as well for more information.  Surgical history: Negative.  See flowsheet/record as well for more information.  Family history: Negative.  See flowsheet/record as well for more information.  Social history: Negative.  See flowsheet/record as well for more information.  Allergies, and medications have been entered into the medical record, reviewed, and no changes needed.   Review of Systems: No fevers, chills, night sweats, weight loss, chest pain, or shortness of breath.   Objective:    General: Well Developed, well nourished, and in no acute distress.  Neuro: Alert and oriented x3, extra-ocular muscles intact, sensation grossly intact.  HEENT: Normocephalic, atraumatic, pupils equal round reactive to light, neck supple, no masses, no lymphadenopathy, thyroid nonpalpable.  Skin: Warm and dry, no rashes. Cardiac: Regular rate and rhythm, no murmurs rubs or gallops, no lower extremity edema.  Respiratory: Clear to auscultation bilaterally. Not using accessory muscles, speaking in full sentences. Bilateral Knees: Normal to inspection with no erythema or effusion or obvious bony abnormalities. Only minimal tenderness at the medial joint lines ROM normal in flexion and extension and lower leg rotation. Ligaments with solid consistent endpoints including ACL, PCL, LCL, MCL. Negative Mcmurray's and provocative meniscal tests. Non painful patellar compression. Patellar and quadriceps tendons unremarkable. Hamstring and quadriceps strength is normal.  Procedure: Real-time Ultrasound Guided Injection of right  knee Device: GE Logiq E  Verbal informed consent obtained.  Time-out conducted.  Noted no overlying erythema, induration, or other signs of local infection.  Skin prepped in a sterile fashion.  Local anesthesia: Topical Ethyl chloride.  With sterile technique and under real time ultrasound guidance:  1 mL Kenalog 40, 2 mL lidocaine, 2 mL bupivacaine injected easily Completed without difficulty  Pain immediately resolved suggesting accurate placement of the medication.  Advised to call if fevers/chills, erythema, induration, drainage, or persistent bleeding.  Images permanently stored and available for review in the ultrasound unit.  Impression: Technically successful ultrasound guided injection.  Procedure: Real-time Ultrasound Guided Injection of left knee Device: GE Logiq E  Verbal informed consent obtained.  Time-out conducted.  Noted no overlying erythema, induration, or other signs of local infection.  Skin prepped in a sterile fashion.  Local anesthesia: Topical Ethyl chloride.  With sterile technique and under real time ultrasound guidance:  1 mL Kenalog 40, 2 mL lidocaine, 2 mL bupivacaine injected easily Completed without difficulty  Pain immediately resolved suggesting accurate placement of the medication.  Advised to call if fevers/chills, erythema, induration, drainage, or persistent bleeding.  Images permanently stored and available for review in the ultrasound unit.  Impression: Technically successful ultrasound guided injection.  Impression and Recommendations:    Primary osteoarthritis of both knees Steroid injection into both knees. She has a trip coming up, we will start Orthovisc when she returns.

## 2017-02-14 NOTE — Assessment & Plan Note (Addendum)
Steroid injection into both knees. She has a trip coming up, we will start Orthovisc when she returns.

## 2017-02-20 ENCOUNTER — Other Ambulatory Visit: Payer: Self-pay | Admitting: Sports Medicine

## 2017-02-23 DIAGNOSIS — Z1231 Encounter for screening mammogram for malignant neoplasm of breast: Secondary | ICD-10-CM | POA: Diagnosis not present

## 2017-02-28 DIAGNOSIS — R351 Nocturia: Secondary | ICD-10-CM | POA: Diagnosis not present

## 2017-02-28 DIAGNOSIS — R8290 Unspecified abnormal findings in urine: Secondary | ICD-10-CM | POA: Diagnosis not present

## 2017-02-28 DIAGNOSIS — R350 Frequency of micturition: Secondary | ICD-10-CM | POA: Diagnosis not present

## 2017-04-07 ENCOUNTER — Ambulatory Visit: Payer: Medicare Other | Admitting: Sports Medicine

## 2017-04-11 DIAGNOSIS — R350 Frequency of micturition: Secondary | ICD-10-CM | POA: Diagnosis not present

## 2017-04-11 DIAGNOSIS — Z6823 Body mass index (BMI) 23.0-23.9, adult: Secondary | ICD-10-CM | POA: Diagnosis not present

## 2017-05-02 ENCOUNTER — Other Ambulatory Visit: Payer: Self-pay | Admitting: Sports Medicine

## 2017-05-06 ENCOUNTER — Telehealth: Payer: Self-pay | Admitting: Sports Medicine

## 2017-05-06 DIAGNOSIS — Z299 Encounter for prophylactic measures, unspecified: Secondary | ICD-10-CM

## 2017-05-06 NOTE — Telephone Encounter (Signed)
Just have her remind me to do the shots next week, orders placed for blood work.

## 2017-05-06 NOTE — Telephone Encounter (Signed)
Patient called scheduled appt 05/12/17 and is requesting cortisone injec in both knees. Pt stated Cortisone injections work best for her. Pt scheduled physical for 05/18/17 and would like to get a lab order sent down to get labs next week. Thanks

## 2017-05-10 ENCOUNTER — Ambulatory Visit: Payer: Medicare Other | Admitting: Sports Medicine

## 2017-05-10 NOTE — Telephone Encounter (Signed)
Okay I will let her know labs have been ordered. Thanks

## 2017-05-12 ENCOUNTER — Encounter: Payer: Self-pay | Admitting: Sports Medicine

## 2017-05-12 ENCOUNTER — Other Ambulatory Visit: Payer: Self-pay | Admitting: Sports Medicine

## 2017-05-12 ENCOUNTER — Ambulatory Visit (INDEPENDENT_AMBULATORY_CARE_PROVIDER_SITE_OTHER): Payer: Medicare Other | Admitting: Sports Medicine

## 2017-05-12 DIAGNOSIS — M170 Bilateral primary osteoarthritis of knee: Secondary | ICD-10-CM | POA: Diagnosis not present

## 2017-05-12 DIAGNOSIS — Z1159 Encounter for screening for other viral diseases: Secondary | ICD-10-CM | POA: Diagnosis not present

## 2017-05-12 DIAGNOSIS — Z131 Encounter for screening for diabetes mellitus: Secondary | ICD-10-CM | POA: Diagnosis not present

## 2017-05-12 DIAGNOSIS — Z0000 Encounter for general adult medical examination without abnormal findings: Secondary | ICD-10-CM | POA: Diagnosis not present

## 2017-05-12 DIAGNOSIS — Z1321 Encounter for screening for nutritional disorder: Secondary | ICD-10-CM | POA: Diagnosis not present

## 2017-05-12 DIAGNOSIS — E785 Hyperlipidemia, unspecified: Secondary | ICD-10-CM | POA: Diagnosis not present

## 2017-05-12 DIAGNOSIS — Z299 Encounter for prophylactic measures, unspecified: Secondary | ICD-10-CM | POA: Diagnosis not present

## 2017-05-12 NOTE — Assessment & Plan Note (Signed)
Bilateral steroid injection, she declines Orthovisc at this point. Tells me she is too busy, we can maybe do this in the future. It was approved in February.

## 2017-05-12 NOTE — Progress Notes (Signed)
Subjective:    CC: Bilateral knee pain  HPI: This is a pleasant 66 year old female, she has known bilateral knee osteoarthritis, we injected her about 3 months ago, pain is moderate, persistent, localized at the medial joint line without radiation or mechanical symptoms. We did discuss Visco supplementation, in fact we got her approved but she has opted not to do it. She did [place] but unfortunately had her purse:.  Past medical history:  Negative.  See flowsheet/record as well for more information.  Surgical history: Negative.  See flowsheet/record as well for more information.  Family history: Negative.  See flowsheet/record as well for more information.  Social history: Negative.  See flowsheet/record as well for more information.  Allergies, and medications have been entered into the medical record, reviewed, and no changes needed.   Review of Systems: No fevers, chills, night sweats, weight loss, chest pain, or shortness of breath.   Objective:    General: Well Developed, well nourished, and in no acute distress.  Neuro: Alert and oriented x3, extra-ocular muscles intact, sensation grossly intact.  HEENT: Normocephalic, atraumatic, pupils equal round reactive to light, neck supple, no masses, no lymphadenopathy, thyroid nonpalpable.  Skin: Warm and dry, no rashes. Cardiac: Regular rate and rhythm, no murmurs rubs or gallops, no lower extremity edema.  Respiratory: Clear to auscultation bilaterally. Not using accessory muscles, speaking in full sentences. Bilateral knees: Normal to inspection with no erythema or effusion or obvious bony abnormalities. Tender to palpation at the medial joint lines ROM normal in flexion and extension and lower leg rotation. Ligaments with solid consistent endpoints including ACL, PCL, LCL, MCL. Negative Mcmurray's and provocative meniscal tests. Non painful patellar compression. Patellar and quadriceps tendons  unremarkable. Hamstring and quadriceps strength is normal.  Procedure: Real-time Ultrasound Guided Injection of left knee Device: GE Logiq E  Verbal informed consent obtained.  Time-out conducted.  Noted no overlying erythema, induration, or other signs of local infection.  Skin prepped in a sterile fashion.  Local anesthesia: Topical Ethyl chloride.  With sterile technique and under real time ultrasound guidance:  1 mL Kenalog 40, 2 mL lidocaine, 2 mL bupivacaine injected easily Completed without difficulty  Pain immediately resolved suggesting accurate placement of the medication.  Advised to call if fevers/chills, erythema, induration, drainage, or persistent bleeding.  Images permanently stored and available for review in the ultrasound unit.  Impression: Technically successful ultrasound guided injection.  Procedure: Real-time Ultrasound Guided Injection of right knee Device: GE Logiq E  Verbal informed consent obtained.  Time-out conducted.  Noted no overlying erythema, induration, or other signs of local infection.  Skin prepped in a sterile fashion.  Local anesthesia: Topical Ethyl chloride.  With sterile technique and under real time ultrasound guidance:  1 mL Kenalog 40, 2 mL lidocaine, 2 mL bupivacaine injected easily Completed without difficulty  Pain immediately resolved suggesting accurate placement of the medication.  Advised to call if fevers/chills, erythema, induration, drainage, or persistent bleeding.  Images permanently stored and available for review in the ultrasound unit.  Impression: Technically successful ultrasound guided injection.  Impression and Recommendations:    Primary osteoarthritis of both knees Bilateral steroid injection, she declines Orthovisc at this point. Tells me she is too busy, we can maybe do this in the future. It was approved in February.  ___________________________________________ Ihor Austinhomas J. Benjamin Stainhekkekandam, M.D., ABFM.,  CAQSM. Primary Care and Sports Medicine [facility]  Adjunct Instructor of Family Medicine  [employer]

## 2017-05-13 LAB — LIPID PANEL W/REFLEX DIRECT LDL
Cholesterol: 211 mg/dL — ABNORMAL HIGH (ref <200)
HDL: 84 mg/dL (ref >50)
LDL Cholesterol (Calc): 110 mg/dL (calc) — ABNORMAL HIGH
Non-HDL Cholesterol (Calc): 127 mg/dL (ref <130)
Total CHOL/HDL Ratio: 2.5 (calc) (ref <5.0)
Triglycerides: 78 mg/dL (ref <150)

## 2017-05-13 LAB — HEPATITIS C ANTIBODY
Hepatitis C Ab: NONREACTIVE
SIGNAL TO CUT-OFF: 0.23 (ref <1.00)

## 2017-05-13 LAB — COMPLETE METABOLIC PANEL WITHOUT GFR
AST: 20 U/L (ref 10–35)
Albumin: 4.5 g/dL (ref 3.6–5.1)
Alkaline phosphatase (APISO): 66 U/L (ref 33–130)
CO2: 29 mmol/L (ref 20–32)
GFR, Est African American: 85 mL/min/1.73m2 (ref >=60)
GFR, Est Non African American: 73 mL/min/1.73m2 (ref >=60)
Glucose, Bld: 81 mg/dL (ref 65–99)
Potassium: 4.3 mmol/L (ref 3.5–5.3)
Sodium: 137 mmol/L (ref 135–146)
Total Bilirubin: 0.4 mg/dL (ref 0.2–1.2)

## 2017-05-13 LAB — HEMOGLOBIN A1C
Hgb A1c MFr Bld: 5.6 % of total Hgb (ref <5.7)
Mean Plasma Glucose: 114 (calc)
eAG (mmol/L): 6.3 (calc)

## 2017-05-13 LAB — COMPLETE METABOLIC PANEL WITH GFR
AG Ratio: 2.1 (calc) (ref 1.0–2.5)
ALT: 17 U/L (ref 6–29)
BUN: 16 mg/dL (ref 7–25)
Calcium: 9.9 mg/dL (ref 8.6–10.4)
Chloride: 101 mmol/L (ref 98–110)
Creat: 0.83 mg/dL (ref 0.50–0.99)
Globulin: 2.1 g/dL (calc) (ref 1.9–3.7)
Total Protein: 6.6 g/dL (ref 6.1–8.1)

## 2017-05-13 LAB — CBC
HCT: 37.5 % (ref 35.0–45.0)
Hemoglobin: 12.7 g/dL (ref 11.7–15.5)
MCH: 30.4 pg (ref 27.0–33.0)
MCHC: 33.9 g/dL (ref 32.0–36.0)
MCV: 89.7 fL (ref 80.0–100.0)
MPV: 9.3 fL (ref 7.5–12.5)
Platelets: 317 10*3/uL (ref 140–400)
RBC: 4.18 10*6/uL (ref 3.80–5.10)
RDW: 12.6 % (ref 11.0–15.0)
WBC: 5.1 Thousand/uL (ref 3.8–10.8)

## 2017-05-13 LAB — TSH: TSH: 1.57 mIU/L (ref 0.40–4.50)

## 2017-05-13 LAB — VITAMIN D 25 HYDROXY (VIT D DEFICIENCY, FRACTURES): Vit D, 25-Hydroxy: 43 ng/mL (ref 30–100)

## 2017-05-18 ENCOUNTER — Encounter: Payer: Self-pay | Admitting: Sports Medicine

## 2017-05-18 ENCOUNTER — Ambulatory Visit (INDEPENDENT_AMBULATORY_CARE_PROVIDER_SITE_OTHER): Payer: Medicare Other | Admitting: Sports Medicine

## 2017-05-18 VITALS — BP 133/87 | HR 66 | Resp 16 | Ht 65.5 in | Wt 141.0 lb

## 2017-05-18 DIAGNOSIS — Z0000 Encounter for general adult medical examination without abnormal findings: Secondary | ICD-10-CM

## 2017-05-18 DIAGNOSIS — Z23 Encounter for immunization: Secondary | ICD-10-CM

## 2017-05-18 DIAGNOSIS — Z13820 Encounter for screening for osteoporosis: Secondary | ICD-10-CM

## 2017-05-18 NOTE — Progress Notes (Signed)
Subjective:   Nolene Bernheimhyllis G Flores is a 66 y.o. female who presents for Medicare Annual (Subsequent) preventive examination.  Review of Systems:  Comprehensive review of systems is negative       Objective:     Vitals: BP 133/87   Pulse 66   Resp 16   Ht 5' 5.5" (1.664 m)   Wt 141 lb (64 kg)   BMI 23.11 kg/m   Body mass index is 23.11 kg/m.   Tobacco History  Smoking Status  . Former Smoker  Smokeless Tobacco  . Never Used     Counseling given: Not Answered   Past Medical History:  Diagnosis Date  . Hyperlipidemia   . Hypertension    Past Surgical History:  Procedure Laterality Date  . BLADDER SUSPENSION    . KNEE SURGERY Right 3 months ago  . tummy tuck     Family History  Problem Relation Age of Onset  . Heart attack Father   . Hyperlipidemia Father   . Cancer Sister        breast  . Cancer Maternal Aunt        breast  . Heart attack Paternal Uncle    History  Sexual Activity  . Sexual activity: Not on file    Outpatient Encounter Prescriptions as of 05/18/2017  Medication Sig  . atorvastatin (LIPITOR) 10 MG tablet Take 10 mg by mouth daily.  . chlorpheniramine-HYDROcodone (TUSSIONEX) 10-8 MG/5ML SUER Take 5 mLs by mouth every 12 (twelve) hours as needed for cough (cough, will cause drowsiness.).  Marland Kitchen. escitalopram (LEXAPRO) 20 MG tablet Take 10 mg by mouth daily.   Marland Kitchen. LORazepam (ATIVAN) 2 MG tablet TAKE 1 TABLET BY MOUTH AT BEDTIME  . montelukast (SINGULAIR) 5 MG chewable tablet Chew 5 mg by mouth at bedtime.  . Riboflavin (B-2 PO) Take by mouth.  . Thiamine HCl (B-1 PO) Take by mouth.  . triamcinolone cream (KENALOG) 0.5 % Apply 1 application topically 2 (two) times daily. To affected areas.  . Vitamin D, Cholecalciferol, 1000 UNITS TABS Take by mouth.  . [DISCONTINUED] LORazepam (ATIVAN) 1 MG tablet    No facility-administered encounter medications on file as of 05/18/2017.     Activities of Daily Living In your present state of health, do  you have any difficulty performing the following activities: 05/18/2017  Hearing? Y  Vision? N  Difficulty concentrating or making decisions? N  Walking or climbing stairs? N  Dressing or bathing? N  Doing errands, shopping? N  Some recent data might be hidden    Patient Care Team: Monica Bectonhekkekandam, Zygmunt Mcglinn J, MD as PCP - General (Family Medicine)    Assessment:    General: Well Developed, well nourished, and in no acute distress.  Neuro: Alert and oriented x3, extra-ocular muscles intact, sensation grossly intact. Cranial nerves II through XII are intact, motor, sensory, and coordinative functions are all intact. HEENT: Normocephalic, atraumatic, pupils equal round reactive to light, neck supple, no masses, no lymphadenopathy, thyroid nonpalpable. Oropharynx, nasopharynx, external ear canals are unremarkable. Skin: Warm and dry, no rashes noted.  Cardiac: Regular rate and rhythm, no murmurs rubs or gallops.  Respiratory: Clear to auscultation bilaterally. Not using accessory muscles, speaking in full sentences.  Abdominal: Soft, nontender, nondistended, positive bowel sounds, no masses, no organomegaly.  Musculoskeletal: Shoulder, elbow, wrist, hip, knee, ankle stable, and with full range of motion.  Exercise Activities and Dietary recommendations    Goals    None     Fall  Risk Fall Risk  05/12/2017 05/26/2015  Falls in the past year? No No   Depression Screen PHQ 2/9 Scores 05/18/2017 05/12/2017 05/26/2015  PHQ - 2 Score 0 1 0  PHQ- 9 Score 1 - -     Cognitive Function     6CIT Screen 05/18/2017  What Year? 0 points  What month? 0 points  What time? 0 points  Count back from 20 0 points  Months in reverse 0 points  Repeat phrase 0 points  Total Score 0    Immunization History  Administered Date(s) Administered  . Pneumococcal Conjugate-13 05/26/2015  . Tdap 05/26/2015   Screening Tests Health Maintenance  Topic Date Due  . Hepatitis C Screening  1951-04-01  . DEXA  SCAN  12/26/2014  . PNA vac Low Risk Adult (2 of 2 - PPSV23) 05/25/2016  . MAMMOGRAM  12/05/2017  . COLONOSCOPY  09/06/2022  . TETANUS/TDAP  05/25/2025      Plan:   see problem list  I have personally reviewed and noted the following in the patient's chart:   . Medical and social history . Use of alcohol, tobacco or illicit drugs  . Current medications and supplements . Functional ability and status . Nutritional status . Physical activity . Advanced directives . List of other physicians . Hospitalizations, surgeries, and ER visits in previous 12 months . Vitals . Screenings to include cognitive, depression, and falls . Referrals and appointments  In addition, I have reviewed and discussed with patient certain preventive protocols, quality metrics, and best practice recommendations. A written personalized care plan for preventive services as well as general preventive health recommendations were provided to patient.     Rodney Langton, MD  05/18/2017

## 2017-05-18 NOTE — Assessment & Plan Note (Signed)
Medicare physical unremarkable, pneumonia 23, declines flu vaccine, awaiting some blood work. Due for bone density testing  Return in one year.

## 2017-05-31 ENCOUNTER — Telehealth: Payer: Self-pay | Admitting: Sports Medicine

## 2017-05-31 NOTE — Telephone Encounter (Signed)
Patient called request to get a bone density test ordered. Please adv thanks

## 2017-05-31 NOTE — Telephone Encounter (Signed)
I've ordered multiple including one just a few days ago (see imaging tab), she just never goes and gets them done (smack forehead).  I can only do so much.

## 2017-07-27 ENCOUNTER — Other Ambulatory Visit: Payer: Self-pay | Admitting: Sports Medicine

## 2017-08-23 ENCOUNTER — Telehealth: Payer: Self-pay | Admitting: Sports Medicine

## 2017-08-23 NOTE — Telephone Encounter (Signed)
Pt called stated received lab bill for $800 and some change.Marland Kitchen.Marland Kitchen.Marland Kitchen.I told her to call Toniann FailWendy regarding about this bill..Marland Kitchen

## 2017-08-24 ENCOUNTER — Ambulatory Visit (INDEPENDENT_AMBULATORY_CARE_PROVIDER_SITE_OTHER): Payer: Medicare Other

## 2017-08-24 DIAGNOSIS — M85851 Other specified disorders of bone density and structure, right thigh: Secondary | ICD-10-CM | POA: Diagnosis not present

## 2017-08-24 DIAGNOSIS — Z780 Asymptomatic menopausal state: Secondary | ICD-10-CM | POA: Diagnosis not present

## 2017-09-15 ENCOUNTER — Ambulatory Visit (INDEPENDENT_AMBULATORY_CARE_PROVIDER_SITE_OTHER): Payer: Medicare Other | Admitting: Sports Medicine

## 2017-09-15 DIAGNOSIS — F39 Unspecified mood [affective] disorder: Secondary | ICD-10-CM | POA: Diagnosis not present

## 2017-09-15 MED ORDER — TRAZODONE HCL 50 MG PO TABS: 50.0000 mg | ORAL_TABLET | Freq: Every day | ORAL | 1 refills | Status: DC

## 2017-09-15 MED ORDER — LORAZEPAM 2 MG PO TABS: 2.0000 mg | ORAL_TABLET | Freq: Every day | ORAL | 0 refills | Status: DC | PRN

## 2017-09-15 MED ORDER — ESCITALOPRAM OXALATE 20 MG PO TABS: 20.0000 mg | ORAL_TABLET | Freq: Every day | ORAL | 3 refills | Status: DC

## 2017-09-15 NOTE — Progress Notes (Signed)
Subjective:    CC: Anxiety and depression  HPI: This is a pleasant 67 year old female, she has been on Lexapro, well-controlled since her husband died over a decade ago.  No suicidal or homicidal ideation.  She is also been using occasional Ativan but unfortunately has been using a little more at bedtime.  More recently she is noted an increase in her anxiety, and depressive symptoms without suicidal or homicidal ideation.  Symptoms are moderate, worsening.  Gets occasional episodes of panic.  Also principal symptoms are difficulty sleeping.  Her mind tends to run and she tends to perseverate about the days activities at night.  I reviewed the past medical history, family history, social history, surgical history, and allergies today and no changes were needed.  Please see the problem list section below in epic for further details.  Past Medical History: Past Medical History:  Diagnosis Date  . Hyperlipidemia   . Hypertension    Past Surgical History: Past Surgical History:  Procedure Laterality Date  . BLADDER SUSPENSION    . KNEE SURGERY Right 3 months ago  . tummy tuck     Social History: Social History   Socioeconomic History  . Marital status: Widowed    Spouse name: Not on file  . Number of children: Not on file  . Years of education: Not on file  . Highest education level: Not on file  Social Needs  . Financial resource strain: Not on file  . Food insecurity - worry: Not on file  . Food insecurity - inability: Not on file  . Transportation needs - medical: Not on file  . Transportation needs - non-medical: Not on file  Occupational History  . Not on file  Tobacco Use  . Smoking status: Former Games developermoker  . Smokeless tobacco: Never Used  Substance and Sexual Activity  . Alcohol use: Yes  . Drug use: No  . Sexual activity: Not on file  Other Topics Concern  . Not on file  Social History Narrative  . Not on file   Family History: Family History  Problem Relation  Age of Onset  . Heart attack Father   . Hyperlipidemia Father   . Cancer Sister        breast  . Cancer Maternal Aunt        breast  . Heart attack Paternal Uncle    Allergies: No Known Allergies Medications: See med rec.  Review of Systems: No fevers, chills, night sweats, weight loss, chest pain, or shortness of breath.   Objective:    General: Well Developed, well nourished, and in no acute distress.  Neuro: Alert and oriented x3, extra-ocular muscles intact, sensation grossly intact.  HEENT: Normocephalic, atraumatic, pupils equal round reactive to light, neck supple, no masses, no lymphadenopathy, thyroid nonpalpable.  Skin: Warm and dry, no rashes. Cardiac: Regular rate and rhythm, no murmurs rubs or gallops, no lower extremity edema.  Respiratory: Clear to auscultation bilaterally. Not using accessory muscles, speaking in full sentences.  Impression and Recommendations:    Mood disorder [facility](HCC) Patient self increased her Lexapro to a full 20 mg which I think is acceptable, having some difficulty sleeping so we will add 10 mg of trazodone at bedtime. I am going to refill her Ativan but I have cautioned her to avoid using this as a crutch to help her sleep as tolerance will develop. Return to see me in 1 month for PHQ/GAD.  I spent 25 minutes with this patient, greater than 50% was  face-to-face time counseling regarding the above diagnoses ___________________________________________ Gwen Her. Dianah Field, M.D., ABFM., CAQSM. Primary Care and [place] Instructor of [place] [employer] [facility] of [employer]

## 2017-09-15 NOTE — Assessment & Plan Note (Signed)
Patient self increased her Lexapro to a full 20 mg which I think is acceptable, having some difficulty sleeping so we will add 10 mg of trazodone at bedtime. I am going to refill her Ativan but I have cautioned her to avoid using this as a crutch to help her sleep as tolerance will develop. Return to see me in 1 month for PHQ/GAD.

## 2017-09-19 DIAGNOSIS — N952 Postmenopausal atrophic vaginitis: Secondary | ICD-10-CM | POA: Diagnosis not present

## 2017-09-19 DIAGNOSIS — R350 Frequency of micturition: Secondary | ICD-10-CM | POA: Diagnosis not present

## 2017-09-20 ENCOUNTER — Telehealth: Payer: Self-pay

## 2017-09-20 NOTE — Telephone Encounter (Signed)
Pt left VM stating she stopped taking Trazodone after 2 days due to nightmares. States she did increase her Lexapro to whole tablet and she's feeling better.

## 2017-09-20 NOTE — Telephone Encounter (Signed)
Noted  

## 2017-09-21 ENCOUNTER — Ambulatory Visit (INDEPENDENT_AMBULATORY_CARE_PROVIDER_SITE_OTHER): Payer: Medicare Other | Admitting: Sports Medicine

## 2017-09-21 ENCOUNTER — Encounter: Payer: Self-pay | Admitting: Sports Medicine

## 2017-09-21 DIAGNOSIS — M170 Bilateral primary osteoarthritis of knee: Secondary | ICD-10-CM | POA: Diagnosis not present

## 2017-09-21 NOTE — Progress Notes (Signed)
Subjective:    CC: Bilateral knee pain  HPI: Maria Li is a pleasant 67 year old female with known bilateral knee osteoarthritis, her last injections were 4 months ago, having a recurrence of pain.  She also told us that she really did not have time to do Visco supplementation.  Pain is moderate, persistent, localized to the joint lines without radiation, no trauma, no mechanical symptoms, no constitutional symptoms.  I reviewed the past medical history, family history, social history, surgical history, and allergies today and no changes were needed.  Please see the problem list section below in epic for further details.  Past Medical History: Past Medical History:  Diagnosis Date  . Hyperlipidemia   . Hypertension    Past Surgical History: Past Surgical History:  Procedure Laterality Date  . BLADDER SUSPENSION    . KNEE SURGERY Right 3 months ago  . tummy tuck     Social History: Social History   Socioeconomic History  . Marital status: Widowed    Spouse name: None  . Number of children: None  . Years of education: None  . Highest education level: None  Social Needs  . Financial resource strain: None  . Food insecurity - worry: None  . Food insecurity - inability: None  . Transportation needs - medical: None  . Transportation needs - non-medical: None  Occupational History  . None  Tobacco Use  . Smoking status: Former Games developer  . Smokeless tobacco: Never Used  Substance and Sexual Activity  . Alcohol use: Yes  . Drug use: No  . Sexual activity: None  Other Topics Concern  . None  Social History Narrative  . None   Family History: Family History  Problem Relation Age of Onset  . Heart attack Father   . Hyperlipidemia Father   . Cancer Sister        breast  . Cancer Maternal Aunt        breast  . Heart attack Paternal Uncle    Allergies: No Known Allergies Medications: See med rec.  Review of Systems: No fevers, chills, night sweats, weight loss, chest  pain, or shortness of breath.   Objective:    General: Well Developed, well nourished, and in no acute distress.  Neuro: Alert and oriented x3, extra-ocular muscles intact, sensation grossly intact.  HEENT: Normocephalic, atraumatic, pupils equal round reactive to light, neck supple, no masses, no lymphadenopathy, thyroid nonpalpable.  Skin: Warm and dry, no rashes. Cardiac: Regular rate and rhythm, no murmurs rubs or gallops, no lower extremity edema.  Respiratory: Clear to auscultation bilaterally. Not using accessory muscles, speaking in full sentences. Bilateral knees: Normal to inspection with no erythema or effusion or obvious bony abnormalities. Minimal pain over the patellar facets and the medial joint lines ROM normal in flexion and extension and lower leg rotation. Ligaments with solid consistent endpoints including ACL, PCL, LCL, MCL. Negative Mcmurray's and provocative meniscal tests. Non painful patellar compression. Patellar and quadriceps tendons unremarkable. Hamstring and quadriceps strength is normal.  Procedure: Real-time Ultrasound Guided Injection of left knee Device: GE Logiq E  Verbal informed consent obtained.  Time-out conducted.  Noted no overlying erythema, induration, or other signs of local infection.  Skin prepped in a sterile fashion.  Local anesthesia: Topical Ethyl chloride.  With sterile technique and under real time ultrasound guidance: 1 cc kenalog 40, 2 cc lidocaine, 2 cc bupivacaine injected easily Completed without difficulty  Pain immediately resolved suggesting accurate placement of the medication.  Advised to call if  fevers/chills, erythema, induration, drainage, or persistent bleeding.  Images permanently stored and available for review in the ultrasound unit.  Impression: Technically successful ultrasound guided injection.  Procedure: Real-time Ultrasound Guided Injection of right knee Device: GE Logiq E  Verbal informed consent  obtained.  Time-out conducted.  Noted no overlying erythema, induration, or other signs of local infection.  Skin prepped in a sterile fashion.  Local anesthesia: Topical Ethyl chloride.  With sterile technique and under real time ultrasound guidance: 1 cc kenalog 40, 2 cc lidocaine, 2 cc bupivacaine injected easily Completed without difficulty  Pain immediately resolved suggesting accurate placement of the medication.  Advised to call if fevers/chills, erythema, induration, drainage, or persistent bleeding.  Images permanently stored and available for review in the ultrasound unit.  Impression: Technically successful ultrasound guided injection.  Impression and Recommendations:    Primary osteoarthritis of both knees 7363-month response to previous injection, repeat bilateral knee joint injections as above, return as needed.  __________________________________________ Maria Li, M.D., ABFM., CAQSM. Primary Care and Sports Medicine [facility]  Adjunct Instructor of Family Medicine  [employer]

## 2017-09-21 NOTE — Assessment & Plan Note (Signed)
5243-month response to previous injection, repeat bilateral knee joint injections as above, return as needed.

## 2017-10-03 DIAGNOSIS — L821 Other seborrheic keratosis: Secondary | ICD-10-CM | POA: Diagnosis not present

## 2017-10-03 DIAGNOSIS — L570 Actinic keratosis: Secondary | ICD-10-CM | POA: Diagnosis not present

## 2017-10-04 ENCOUNTER — Other Ambulatory Visit: Payer: Self-pay

## 2017-10-04 MED ORDER — ESCITALOPRAM OXALATE 20 MG PO TABS: 20.0000 mg | ORAL_TABLET | Freq: Every day | ORAL | 0 refills | Status: DC

## 2017-10-04 NOTE — Progress Notes (Signed)
90 day rx requested instead of 30 day with refills.

## 2017-10-13 ENCOUNTER — Ambulatory Visit: Payer: Medicare Other | Admitting: Sports Medicine

## 2017-10-17 DIAGNOSIS — R87820 Cervical low risk human papillomavirus (HPV) DNA test positive: Secondary | ICD-10-CM | POA: Diagnosis not present

## 2017-10-17 DIAGNOSIS — R87810 Cervical high risk human papillomavirus (HPV) DNA test positive: Secondary | ICD-10-CM | POA: Diagnosis not present

## 2017-10-17 DIAGNOSIS — Z79899 Other long term (current) drug therapy: Secondary | ICD-10-CM | POA: Diagnosis not present

## 2017-10-17 DIAGNOSIS — Z01411 Encounter for gynecological examination (general) (routine) with abnormal findings: Secondary | ICD-10-CM | POA: Diagnosis not present

## 2017-10-17 DIAGNOSIS — Z8742 Personal history of other diseases of the female genital tract: Secondary | ICD-10-CM | POA: Diagnosis not present

## 2017-10-17 DIAGNOSIS — Z124 Encounter for screening for malignant neoplasm of cervix: Secondary | ICD-10-CM | POA: Diagnosis not present

## 2017-10-17 DIAGNOSIS — R87610 Atypical squamous cells of undetermined significance on cytologic smear of cervix (ASC-US): Secondary | ICD-10-CM | POA: Diagnosis not present

## 2017-10-17 DIAGNOSIS — Z1151 Encounter for screening for human papillomavirus (HPV): Secondary | ICD-10-CM | POA: Diagnosis not present

## 2017-10-17 DIAGNOSIS — Z01419 Encounter for gynecological examination (general) (routine) without abnormal findings: Secondary | ICD-10-CM | POA: Diagnosis not present

## 2017-10-21 ENCOUNTER — Ambulatory Visit (INDEPENDENT_AMBULATORY_CARE_PROVIDER_SITE_OTHER): Payer: Medicare Other | Admitting: Sports Medicine

## 2017-10-21 ENCOUNTER — Encounter: Payer: Self-pay | Admitting: Sports Medicine

## 2017-10-21 DIAGNOSIS — F39 Unspecified mood [affective] disorder: Secondary | ICD-10-CM

## 2017-10-21 MED ORDER — LORAZEPAM 2 MG PO TABS: 2.0000 mg | ORAL_TABLET | Freq: Every day | ORAL | 0 refills | Status: DC | PRN

## 2017-10-21 NOTE — Assessment & Plan Note (Signed)
Self discontinued trazodone, continue Lexapro 20. Anxiety is well controlled. She did have some billing questions which I have advised her to direct to the billing department.

## 2017-10-21 NOTE — Progress Notes (Signed)
  Subjective:    CC: Follow-up  HPI: Anxiety depression: Self discontinued trazodone, she continues to 20 mg of Lexapro and feels great.  I reviewed the past medical history, family history, social history, surgical history, and allergies today and no changes were needed.  Please see the problem list section below in epic for further details.  Past Medical History: Past Medical History:  Diagnosis Date  . Hyperlipidemia   . Hypertension    Past Surgical History: Past Surgical History:  Procedure Laterality Date  . BLADDER SUSPENSION    . KNEE SURGERY Right 3 months ago  . tummy tuck     Social History: Social History   Socioeconomic History  . Marital status: Widowed    Spouse name: None  . Number of children: None  . Years of education: None  . Highest education level: None  Social Needs  . Financial resource strain: None  . Food insecurity - worry: None  . Food insecurity - inability: None  . Transportation needs - medical: None  . Transportation needs - non-medical: None  Occupational History  . None  Tobacco Use  . Smoking status: Former Games developermoker  . Smokeless tobacco: Never Used  Substance and Sexual Activity  . Alcohol use: Yes  . Drug use: No  . Sexual activity: None  Other Topics Concern  . None  Social History Narrative  . None   Family History: Family History  Problem Relation Age of Onset  . Heart attack Father   . Hyperlipidemia Father   . Cancer Sister        breast  . Cancer Maternal Aunt        breast  . Heart attack Paternal Uncle    Allergies: No Known Allergies Medications: See med rec.  Review of Systems: No fevers, chills, night sweats, weight loss, chest pain, or shortness of breath.   Objective:    General: Well Developed, well nourished, and in no acute distress.  Neuro: Alert and oriented x3, extra-ocular muscles intact, sensation grossly intact.  HEENT: Normocephalic, atraumatic, pupils equal round reactive to light, neck  supple, no masses, no lymphadenopathy, thyroid nonpalpable.  Skin: Warm and dry, no rashes. Cardiac: Regular rate and rhythm, no murmurs rubs or gallops, no lower extremity edema.  Respiratory: Clear to auscultation bilaterally. Not using accessory muscles, speaking in full sentences.  Impression and Recommendations:    Mood disorder (HCC) Self discontinued trazodone, continue Lexapro 20. Anxiety is well controlled. She did have some billing questions which I have advised her to direct to the billing department. ___________________________________________ Ihor Austinhomas J. Benjamin Stainhekkekandam, M.D., ABFM., CAQSM. Primary Care and Sports Medicine [facility]  Adjunct Instructor of Family Medicine  [employer]

## 2017-12-08 ENCOUNTER — Ambulatory Visit (INDEPENDENT_AMBULATORY_CARE_PROVIDER_SITE_OTHER): Payer: Medicare Other | Admitting: Sports Medicine

## 2017-12-08 ENCOUNTER — Encounter: Payer: Self-pay | Admitting: Sports Medicine

## 2017-12-08 DIAGNOSIS — M170 Bilateral primary osteoarthritis of knee: Secondary | ICD-10-CM

## 2017-12-08 NOTE — Progress Notes (Signed)
Subjective:    CC: Bilateral knee pain  HPI: This is a pleasant 67 year old female, worsening bilateral knee pain, under the patella and on both joint lines, persistent, localized without radiation.  No mechanical symptoms, no trauma, previous injections were 3 months ago.  I reviewed the past medical history, family history, social history, surgical history, and allergies today and no changes were needed.  Please see the problem list section below in epic for further details.  Past Medical History: Past Medical History:  Diagnosis Date  . Hyperlipidemia   . Hypertension    Past Surgical History: Past Surgical History:  Procedure Laterality Date  . BLADDER SUSPENSION    . KNEE SURGERY Right 3 months ago  . tummy tuck     Social History: Social History   Socioeconomic History  . Marital status: Widowed    Spouse name: Not on file  . Number of children: Not on file  . Years of education: Not on file  . Highest education level: Not on file  Occupational History  . Not on file  Social Needs  . Financial resource strain: Not on file  . Food insecurity:    Worry: Not on file    Inability: Not on file  . Transportation needs:    Medical: Not on file    Non-medical: Not on file  Tobacco Use  . Smoking status: Former Games developer  . Smokeless tobacco: Never Used  Substance and Sexual Activity  . Alcohol use: Yes  . Drug use: No  . Sexual activity: Not on file  Lifestyle  . Physical activity:    Days per week: Not on file    Minutes per session: Not on file  . Stress: Not on file  Relationships  . Social connections:    Talks on phone: Not on file    Gets together: Not on file    Attends religious service: Not on file    Active member of club or organization: Not on file    Attends meetings of clubs or organizations: Not on file    Relationship status: Not on file  Other Topics Concern  . Not on file  Social History Narrative  . Not on file   Family  History: Family History  Problem Relation Age of Onset  . Heart attack Father   . Hyperlipidemia Father   . Cancer Sister        breast  . Cancer Maternal Aunt        breast  . Heart attack Paternal Uncle    Allergies: No Known Allergies Medications: See med rec.  Review of Systems: No fevers, chills, night sweats, weight loss, chest pain, or shortness of breath.   Objective:    General: Well Developed, well nourished, and in no acute distress.  Neuro: Alert and oriented x3, extra-ocular muscles intact, sensation grossly intact.  HEENT: Normocephalic, atraumatic, pupils equal round reactive to light, neck supple, no masses, no lymphadenopathy, thyroid nonpalpable.  Skin: Warm and dry, no rashes. Cardiac: Regular rate and rhythm, no murmurs rubs or gallops, no lower extremity edema.  Respiratory: Clear to auscultation bilaterally. Not using accessory muscles, speaking in full sentences. Bilateral knees: Normal to inspection with no erythema or effusion or obvious bony abnormalities. Tender to palpation at the patellar facets and under the medial joint line ROM normal in flexion and extension and lower leg rotation. Ligaments with solid consistent endpoints including ACL, PCL, LCL, MCL. Negative Mcmurray's and provocative meniscal tests. Non painful patellar compression.  Patellar and quadriceps tendons unremarkable. Hamstring and quadriceps strength is normal.  Procedure: Real-time Ultrasound Guided Injection of left knee Device: GE Logiq E  Verbal informed consent obtained.  Time-out conducted.  Noted no overlying erythema, induration, or other signs of local infection.  Skin prepped in a sterile fashion.  Local anesthesia: Topical Ethyl chloride.  With sterile technique and under real time ultrasound guidance: 1 cc Kenalog 40, 2 cc lidocaine, 2 cc bupivacaine injected easily Completed without difficulty  Pain immediately resolved suggesting accurate placement of the  medication.  Advised to call if fevers/chills, erythema, induration, drainage, or persistent bleeding.  Images permanently stored and available for review in the ultrasound unit.  Impression: Technically successful ultrasound guided injection.  Procedure: Real-time Ultrasound Guided Injection of right knee Device: GE Logiq E  Verbal informed consent obtained.  Time-out conducted.  Noted no overlying erythema, induration, or other signs of local infection.  Skin prepped in a sterile fashion.  Local anesthesia: Topical Ethyl chloride.  With sterile technique and under real time ultrasound guidance: 1 cc Kenalog 40, 2 cc lidocaine, 2 cc bupivacaine injected easily Completed without difficulty  Pain immediately resolved suggesting accurate placement of the medication.  Advised to call if fevers/chills, erythema, induration, drainage, or persistent bleeding.  Images permanently stored and available for review in the ultrasound unit.  Impression: Technically successful ultrasound guided injection.  Impression and Recommendations:    Primary osteoarthritis of both knees Steroid injection bilaterally. She is going to think about Visco supplementation again.   Previous injection was 3 months ago.  __________________________________________ [name]. [name], M.D., ABFM., CAQSM. Primary Care and Sports Medicine [facility]  Adjunct Instructor of Family Medicine  [employer]

## 2017-12-08 NOTE — Assessment & Plan Note (Signed)
Steroid injection bilaterally. She is going to think about Visco supplementation again.   Previous injection was 3 months ago.

## 2017-12-19 ENCOUNTER — Telehealth: Payer: Self-pay | Admitting: Sports Medicine

## 2017-12-19 NOTE — Telephone Encounter (Signed)
Maria Li called and LM on my voicemail about her [facility] lab bill.  I called today and spoke w/Maria W.  There are three labs (TSH, A1C and Hep C) that Medicare is not paying for.  I added the below diagnosis codes and they will re file the claim and we need to allow 45 days for reprocessing.  WB  TSH - E78.5 A1C - Z13.1 Hep C - Z11.59

## 2017-12-22 IMAGING — DX DG CHEST 2V
2 series · 2 of 2 positions shown · non-contrast
Comparison: None.

CLINICAL DATA: 65-year-old female with cough. Nonsmoker. Initial
encounter.

EXAM:
CHEST  2 VIEW

[chest pa]
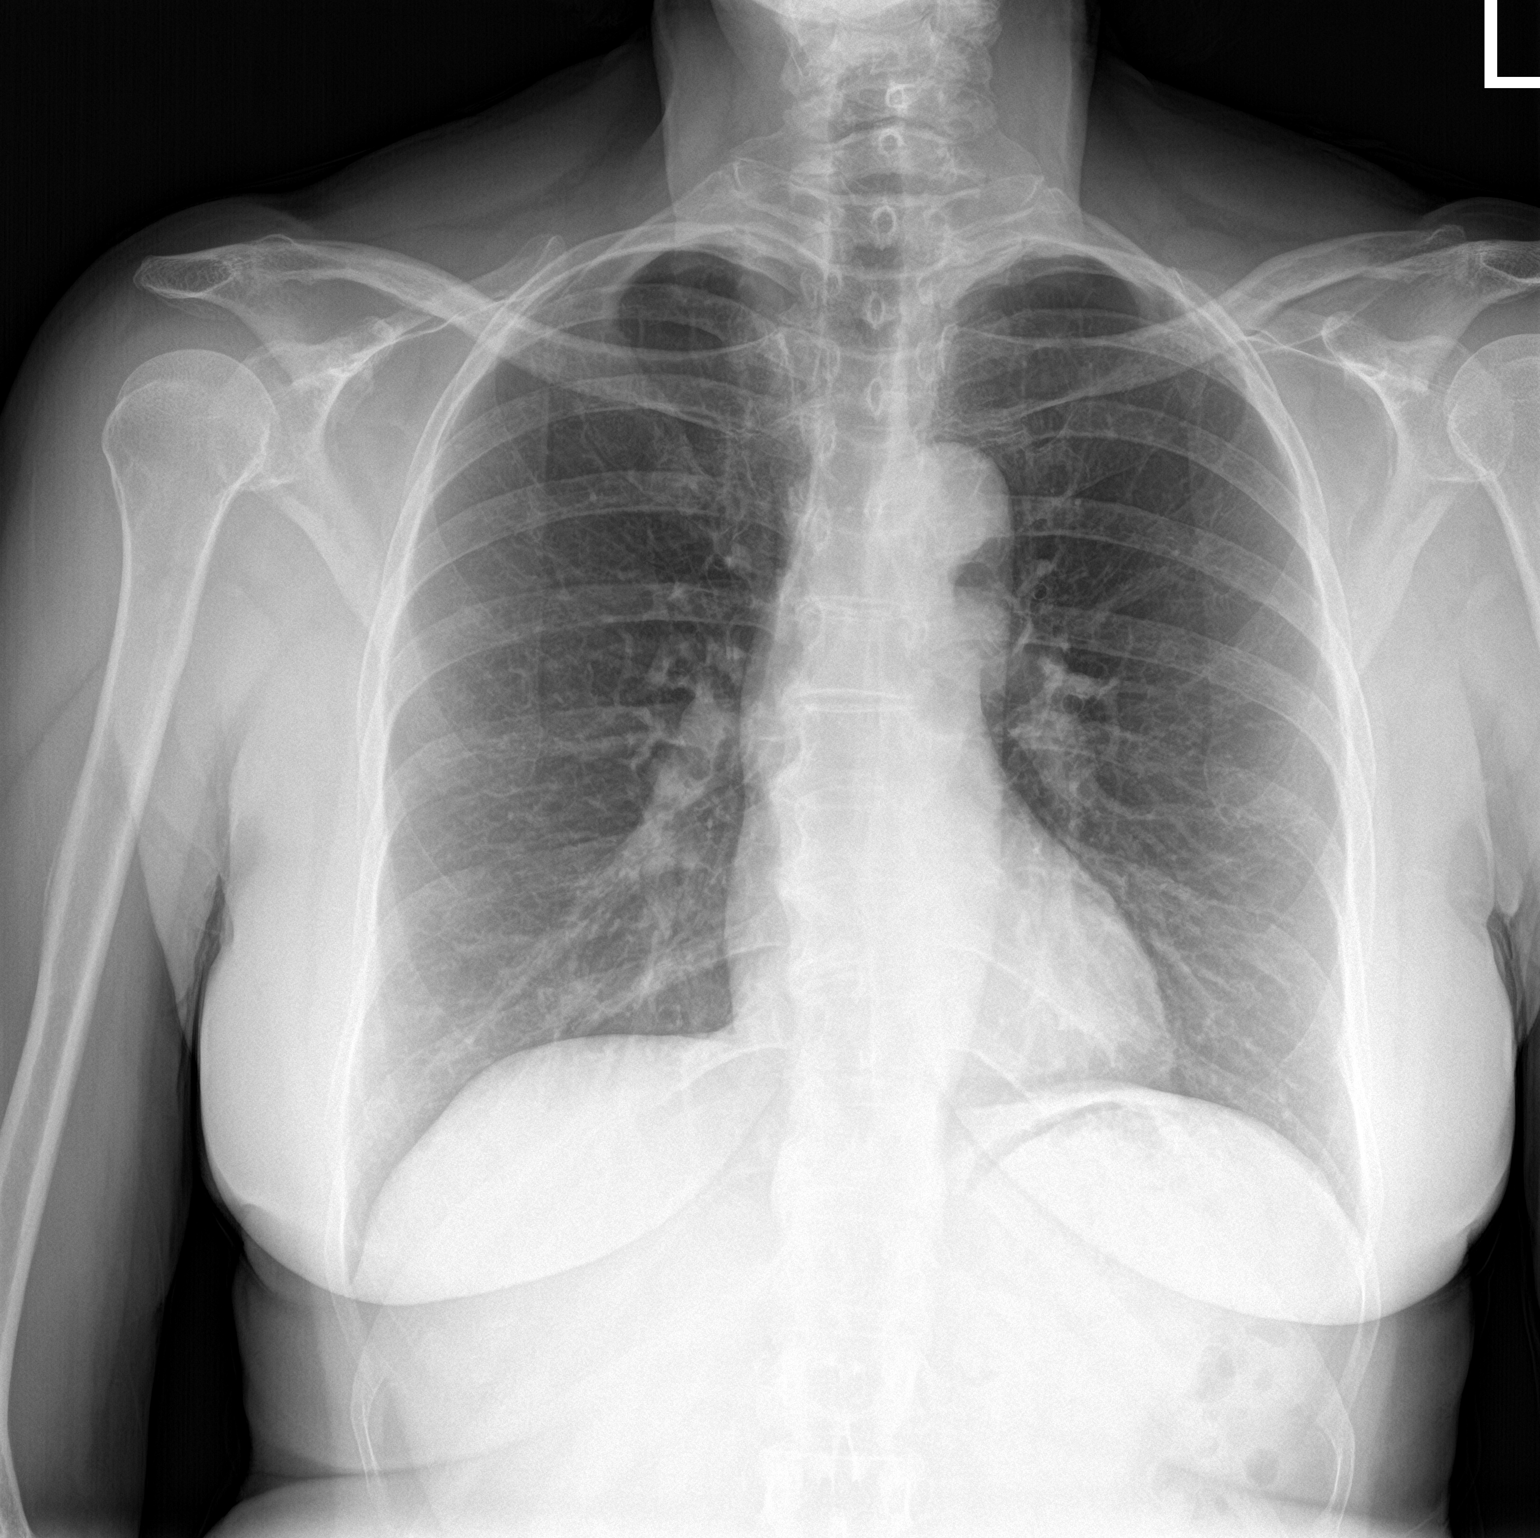

[chest lat]
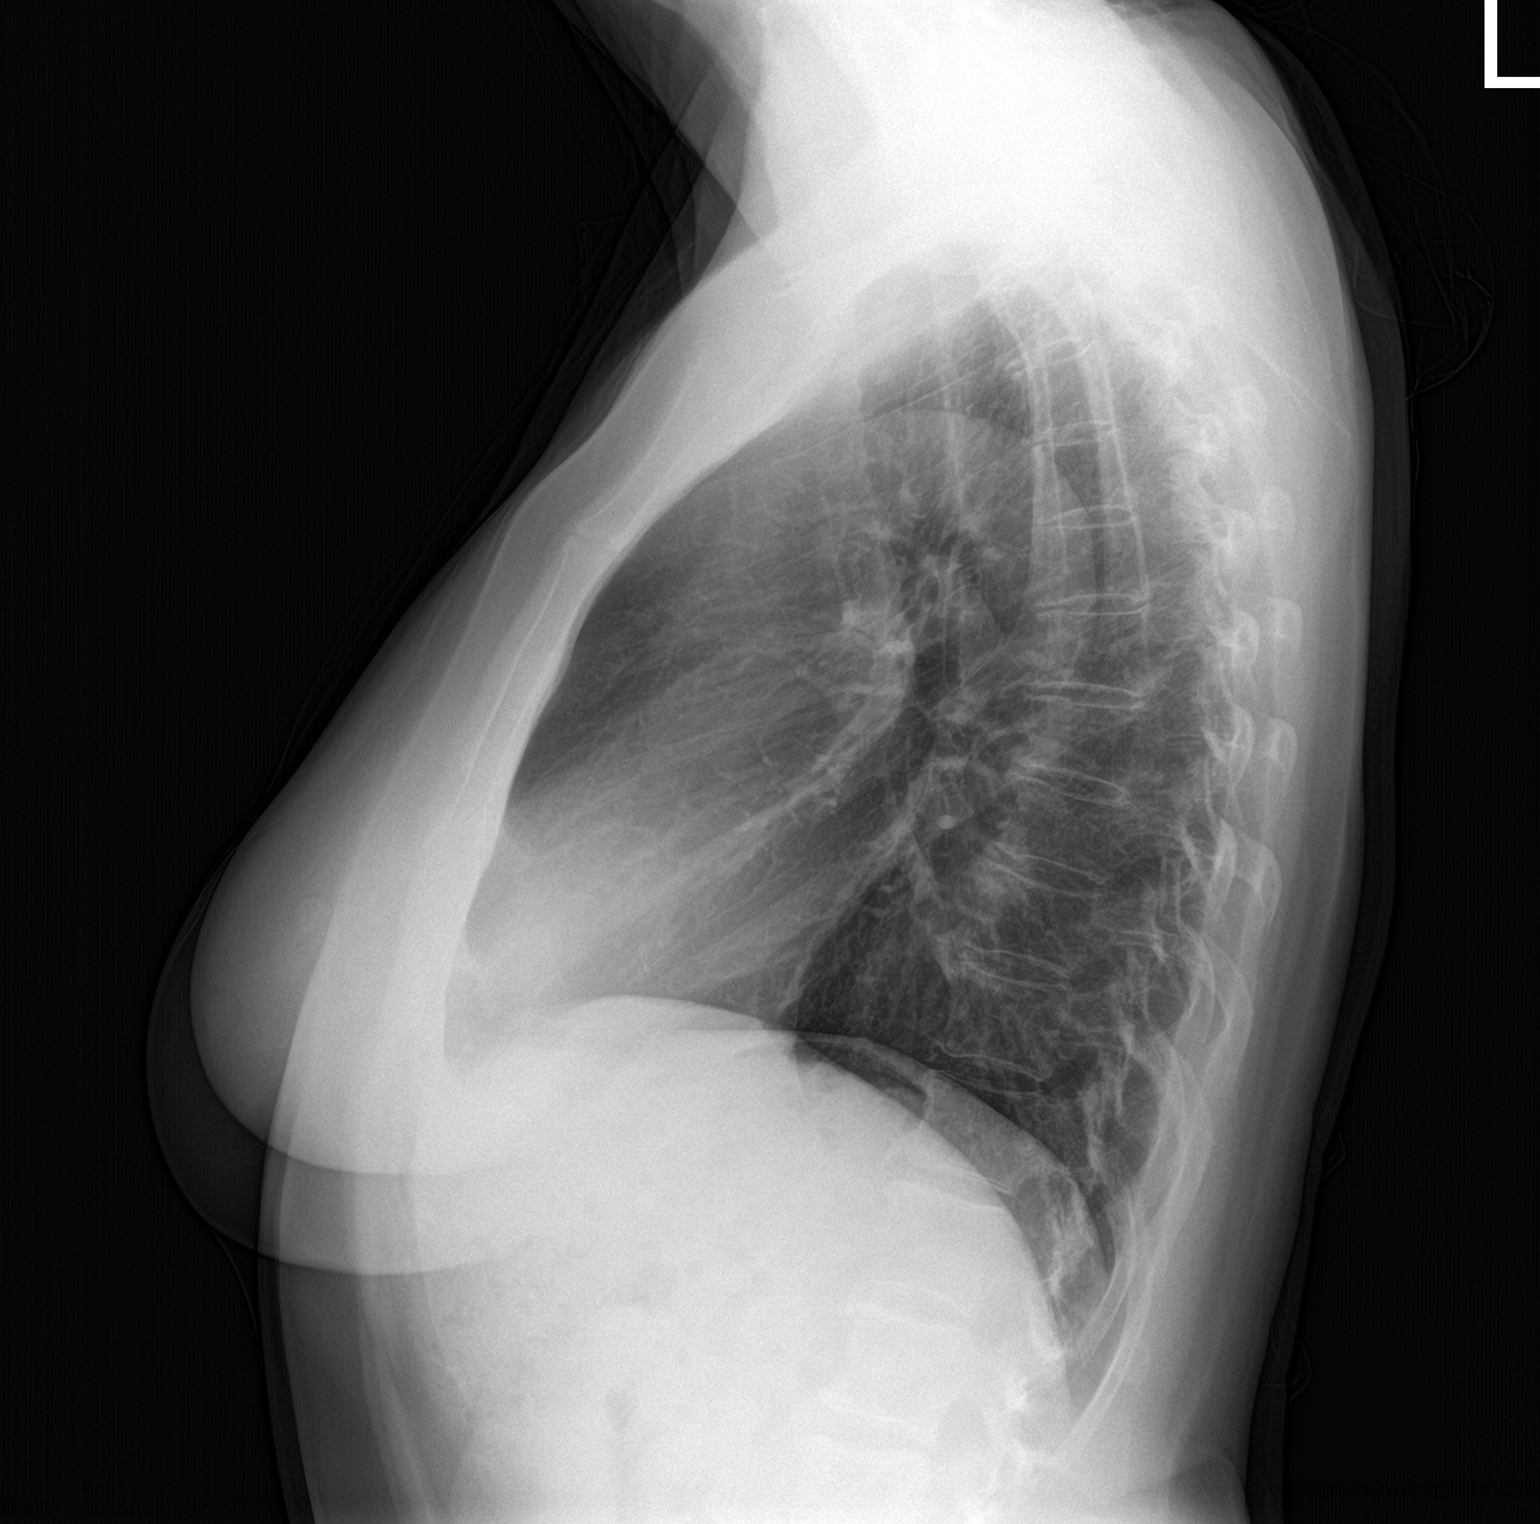

[2 of 2 positions shown; findings below may reference images not displayed]

FINDINGS: No infiltrate, congestive heart failure or pneumothorax.

No plain film evidence of pulmonary malignancy.

Heart size within normal limits.

Minimally tortuous aorta.

Mild acromioclavicular joint degenerative changes.
IMPRESSION: No active cardiopulmonary disease.

## 2018-01-12 ENCOUNTER — Other Ambulatory Visit: Payer: Self-pay | Admitting: Sports Medicine

## 2018-02-13 ENCOUNTER — Other Ambulatory Visit: Payer: Self-pay | Admitting: Sports Medicine

## 2018-02-27 DIAGNOSIS — Z1231 Encounter for screening mammogram for malignant neoplasm of breast: Secondary | ICD-10-CM | POA: Diagnosis not present

## 2018-03-02 ENCOUNTER — Telehealth: Payer: Self-pay | Admitting: Sports Medicine

## 2018-03-02 ENCOUNTER — Ambulatory Visit (INDEPENDENT_AMBULATORY_CARE_PROVIDER_SITE_OTHER): Payer: Medicare Other | Admitting: Sports Medicine

## 2018-03-02 VITALS — BP 119/79 | HR 80

## 2018-03-02 DIAGNOSIS — R350 Frequency of micturition: Secondary | ICD-10-CM

## 2018-03-02 DIAGNOSIS — M545 Low back pain, unspecified: Secondary | ICD-10-CM

## 2018-03-02 DIAGNOSIS — N3000 Acute cystitis without hematuria: Secondary | ICD-10-CM | POA: Diagnosis not present

## 2018-03-02 DIAGNOSIS — M170 Bilateral primary osteoarthritis of knee: Secondary | ICD-10-CM | POA: Diagnosis not present

## 2018-03-02 LAB — POCT URINALYSIS DIPSTICK
Bilirubin, UA: NEGATIVE
Blood, UA: NEGATIVE
Glucose, UA: NEGATIVE
Ketones, UA: NEGATIVE
Nitrite, UA: NEGATIVE
Protein, UA: NEGATIVE
Spec Grav, UA: 1.025 (ref 1.010–1.025)
Urobilinogen, UA: 0.2 E.U./dL
pH, UA: 5.5 (ref 5.0–8.0)

## 2018-03-02 MED ORDER — CEPHALEXIN 500 MG PO CAPS: 500.0000 mg | ORAL_CAPSULE | Freq: Two times a day (BID) | ORAL | 0 refills | Status: DC

## 2018-03-02 MED ORDER — PHENAZOPYRIDINE HCL 200 MG PO TABS: 200.0000 mg | ORAL_TABLET | Freq: Three times a day (TID) | ORAL | 0 refills | Status: AC

## 2018-03-02 MED ORDER — DICLOFENAC SODIUM 1 % TD GEL: 4.0000 g | Freq: Four times a day (QID) | TRANSDERMAL | 11 refills | Status: DC

## 2018-03-02 NOTE — Telephone Encounter (Signed)
-----   Message from Neldon LabellaHolden S Mabe, CMA sent at 03/02/2018  3:33 PM EDT ----- Information has been submitted to Monovisc and awaiting determination.   ----- Message ----- From: Monica Bectonhekkekandam, Thomas J, MD Sent: 03/02/2018   2:37 PM To: Neldon LabellaHolden S Mabe, CMA  Orthovisc and Monovisc approval please, both knees, patient not sure which one she wants to do. ___________________________________________ Ihor Austinhomas J. Benjamin Stainhekkekandam, M.D., ABFM., CAQSM. Primary Care and Sports Medicine [facility]  Adjunct Instructor of Family Medicine  [employer]

## 2018-03-02 NOTE — Assessment & Plan Note (Signed)
Injection was 2-1/2 months ago, recurrence of pain. At this point we are going to get her approved for Orthovisc, she would rather do this than Monovisc. Adding Voltaren gel, rehab exercises. Continue NSAIDs as needed.

## 2018-03-02 NOTE — Assessment & Plan Note (Signed)
Keflex, Pyridium. Urine culture. Return as needed.

## 2018-03-02 NOTE — Progress Notes (Signed)
Subjective:    CC: Knee pain  HPI: Maria Li returns, she is a 67 year old female with bilateral knee osteoarthritis, she had injections about 2-1/2 months ago with recurrence of pain.  We have tried to proceed with Visco supplementation, she has been somewhat resistant historically.  Pain is moderate, persistent, localized at the medial joint line without radiation, mechanical symptoms, no recent trauma.  In addition she has some new onset dysuria, urgency, frequency without too much flank pain, no constitutional symptoms.  I reviewed the past medical history, family history, social history, surgical history, and allergies today and no changes were needed.  Please see the problem list section below in epic for further details.  Past Medical History: Past Medical History:  Diagnosis Date  . Hyperlipidemia   . Hypertension    Past Surgical History: Past Surgical History:  Procedure Laterality Date  . BLADDER SUSPENSION    . KNEE SURGERY Right 3 months ago  . tummy tuck     Social History: Social History   Socioeconomic History  . Marital status: Widowed    Spouse name: Not on file  . Number of children: Not on file  . Years of education: Not on file  . Highest education level: Not on file  Occupational History  . Not on file  Social Needs  . Financial resource strain: Not on file  . Food insecurity:    Worry: Not on file    Inability: Not on file  . Transportation needs:    Medical: Not on file    Non-medical: Not on file  Tobacco Use  . Smoking status: Former Games developer  . Smokeless tobacco: Never Used  Substance and Sexual Activity  . Alcohol use: Yes  . Drug use: No  . Sexual activity: Not on file  Lifestyle  . Physical activity:    Days per week: Not on file    Minutes per session: Not on file  . Stress: Not on file  Relationships  . Social connections:    Talks on phone: Not on file    Gets together: Not on file    Attends religious service: Not on file   Active member of club or organization: Not on file    Attends meetings of clubs or organizations: Not on file    Relationship status: Not on file  Other Topics Concern  . Not on file  Social History Narrative  . Not on file   Family History: Family History  Problem Relation Age of Onset  . Heart attack Father   . Hyperlipidemia Father   . Cancer Sister        breast  . Cancer Maternal Aunt        breast  . Heart attack Paternal Uncle    Allergies: No Known Allergies Medications: See med rec.  Review of Systems: No fevers, chills, night sweats, weight loss, chest pain, or shortness of breath.   Objective:    General: Well Developed, well nourished, and in no acute distress.  Neuro: Alert and oriented x3, extra-ocular muscles intact, sensation grossly intact.  HEENT: Normocephalic, atraumatic, pupils equal round reactive to light, neck supple, no masses, no lymphadenopathy, thyroid nonpalpable.  Skin: Warm and dry, no rashes. Cardiac: Regular rate and rhythm, no murmurs rubs or gallops, no lower extremity edema.  Respiratory: Clear to auscultation bilaterally. Not using accessory muscles, speaking in full sentences. Abdomen: Soft, nontender, not normal bowel sounds, no palpable masses, no guarding, rigidity, rebound tenderness, no costovertebral pain.  Urinalysis  is positive for leukocytes.  Impression and Recommendations:    Primary osteoarthritis of both knees Injection was 2-1/2 months ago, recurrence of pain. At this point we are going to get her approved for Orthovisc, she would rather do this than Monovisc. Adding Voltaren gel, rehab exercises. Continue NSAIDs as needed.  Acute cystitis Keflex, Pyridium. Urine culture. Return as needed. ___________________________________________ Ihor Austinhomas J. Benjamin Stainhekkekandam, M.D., ABFM., CAQSM. Primary Care and Sports Medicine [facility]  Adjunct Instructor of Family Medicine  [employer]

## 2018-03-03 LAB — URINE CULTURE
MICRO NUMBER:: 90769959
Result:: NO GROWTH
SPECIMEN QUALITY:: ADEQUATE

## 2018-03-06 NOTE — Telephone Encounter (Signed)
Per Medicare guidelines, Monovisc is covered. There is no copay. The deductible has been met and the patient's responsibility is 20% of the allowable amount. The out of pocket does not apply. Call reference number is [id number].  Patient does not want to move forward with the injections at this time.

## 2018-03-15 ENCOUNTER — Other Ambulatory Visit: Payer: Self-pay | Admitting: Sports Medicine

## 2018-03-15 NOTE — Telephone Encounter (Signed)
[facility] requesting RF on Lorazepam   Last RX sent 02-13-18 for #30 no refills  RX pended, please review and send if appropriate  Thanks!

## 2018-03-17 ENCOUNTER — Ambulatory Visit (INDEPENDENT_AMBULATORY_CARE_PROVIDER_SITE_OTHER): Payer: Medicare Other | Admitting: Physician Assistant

## 2018-03-17 ENCOUNTER — Encounter: Payer: Self-pay | Admitting: Physician Assistant

## 2018-03-17 VITALS — BP 144/87 | HR 52 | Temp 98.2°F | Wt 143.0 lb

## 2018-03-17 DIAGNOSIS — R55 Syncope and collapse: Secondary | ICD-10-CM

## 2018-03-17 DIAGNOSIS — R030 Elevated blood-pressure reading, without diagnosis of hypertension: Secondary | ICD-10-CM | POA: Diagnosis not present

## 2018-03-17 HISTORY — DX: Syncope and collapse: R55

## 2018-03-17 LAB — POCT URINALYSIS DIPSTICK
BILIRUBIN UA: NEGATIVE
Blood, UA: NEGATIVE
GLUCOSE UA: NEGATIVE
KETONES UA: NEGATIVE
Nitrite, UA: NEGATIVE
PH UA: 6 (ref 5.0–8.0)
Protein, UA: NEGATIVE
Spec Grav, UA: 1.025 (ref 1.010–1.025)
UROBILINOGEN UA: 0.2 U/dL

## 2018-03-17 LAB — GLUCOSE, POCT (MANUAL RESULT ENTRY): POC Glucose: 108 mg/dl — AB (ref 70–99)

## 2018-03-17 MED ORDER — ESCITALOPRAM OXALATE 20 MG PO TABS: 10.0000 mg | ORAL_TABLET | Freq: Every day | ORAL | 0 refills | Status: DC

## 2018-03-17 MED ORDER — LORAZEPAM 2 MG PO TABS: 1.0000 mg | ORAL_TABLET | Freq: Every day | ORAL | 0 refills | Status: DC

## 2018-03-17 MED ORDER — FLUCONAZOLE 150 MG PO TABS: 150.0000 mg | ORAL_TABLET | Freq: Once | ORAL | 0 refills | Status: AC

## 2018-03-17 NOTE — Progress Notes (Signed)
HPI:                                                                Maria Li is a 67 y.o. female who presents to [facility]: Primary Care Sports Medicine today for lightheadedness  For the last 2 days patient reports recurrent episodes of nausea and lightheadedness associated with a "hot feeling running through me." Reports 3-4 episodes yesterday and today, lasting minutes Episodes are sporadic without any known triggers Denies vision change, chest pain, palpitations, abnormal heart rate, diaphoresis or loss of consciousness Admits to skipping meals, she only ate canteloupe for dinner  Depression screen [facility] 2/9 03/02/2018 09/15/2017 05/18/2017 05/12/2017 05/26/2015  Decreased Interest 1 1 0 1 0  Down, Depressed, Hopeless 1 1 0 0 0  PHQ - 2 Score 2 2 0 1 0  Altered sleeping 2 3 1  - -  Tired, decreased energy 1 1 0 - -  Change in appetite 0 0 0 - -  Feeling bad or failure about yourself  0 0 0 - -  Trouble concentrating 1 1 0 - -  Moving slowly or fidgety/restless 0 0 0 - -  Suicidal thoughts 0 0 0 - -  PHQ-9 Score 6 7 1  - -  Difficult doing work/chores Not difficult at all Somewhat difficult - - -    GAD 7 : Generalized Anxiety Score 03/02/2018 09/15/2017  Nervous, Anxious, on Edge 0 2  Control/stop worrying 0 2  Worry too much - different things 0 2  Trouble relaxing 0 2  Restless 0 2  Easily annoyed or irritable 0 0  Afraid - awful might happen 0 0  Total GAD 7 Score 0 10  Anxiety Difficulty Not difficult at all Not difficult at all      Past Medical History:  Diagnosis Date  . Hyperlipidemia   . Hypertension    Past Surgical History:  Procedure Laterality Date  . BLADDER SUSPENSION    . KNEE SURGERY Right 3 months ago  . tummy tuck     Social History   Tobacco Use  . Smoking status: Former Games developer  . Smokeless tobacco: Never Used  Substance Use Topics  . Alcohol use: Yes   family history includes Cancer in her maternal aunt and  sister; Heart attack in her father and paternal uncle; Hyperlipidemia in her father.    ROS: negative except as noted in the HPI  Medications: Current Outpatient Medications  Medication Sig Dispense Refill  . diclofenac sodium (VOLTAREN) 1 % GEL Apply 4 g topically 4 (four) times daily. To affected joint. 100 g 11  . escitalopram (LEXAPRO) 20 MG tablet Take 0.5 tablets (10 mg total) by mouth daily. 90 tablet 0  . LORazepam (ATIVAN) 2 MG tablet Take 0.5 tablets (1 mg total) by mouth at bedtime. 30 tablet 0  . Riboflavin (B-2 PO) Take by mouth.    . Thiamine HCl (B-1 PO) Take by mouth.    . Vitamin D, Cholecalciferol, 1000 UNITS TABS Take by mouth.    . fluconazole (DIFLUCAN) 150 MG tablet Take 1 tablet (150 mg total) by mouth once for 1 dose. 1 tablet 0   No current facility-administered medications for this visit.    No Known Allergies  Objective:  BP (!) 144/87   Pulse (!) 52   Temp 98.2 F (36.8 C) (Oral)   Wt 143 lb (64.9 kg)   BMI 23.43 kg/m  Gen:  alert, not ill-appearing, no distress, appropriate for age HEENT: head normocephalic without obvious abnormality, conjunctiva and cornea clear, trachea midline Pulm: Normal work of breathing, normal phonation, clear to auscultation bilaterally, no wheezes, rales or rhonchi CV: bradycardic, regular rhythm, s1 and s2 distinct, no murmurs, clicks or rubs  Neuro: alert and oriented x 3, no tremor MSK: extremities atraumatic, normal gait and station Skin: intact, no rashes on exposed skin, no jaundice, no cyanosis Psych: well-groomed, cooperative, good eye contact, euthymic mood, affect mood-congruent, speech is articulate, and thought processes clear and goal-directed  Orthostatic VS for the past 24 hrs:  BP- Lying Pulse- Lying BP- Sitting Pulse- Sitting BP- Standing at 0 minutes Pulse- Standing at 0 minutes  03/17/18 1612 148/77 57 144/78 61 131/76 60       Results for orders placed or performed in visit on 03/17/18  (from the past 72 hour(s))  POCT Glucose (CBG)     Status: Abnormal   Collection Time: 03/17/18  4:21 PM  Result Value Ref Range   POC Glucose 108 (A) 70 - 99 mg/dl  POCT Urinalysis Dipstick     Status: Abnormal   Collection Time: 03/17/18  4:43 PM  Result Value Ref Range   Color, UA yellow    Clarity, UA clear    Glucose, UA Negative Negative   Bilirubin, UA negative    Ketones, UA negative    Spec Grav, UA 1.025 1.010 - 1.025   Blood, UA negative    pH, UA 6.0 5.0 - 8.0   Protein, UA Negative Negative   Urobilinogen, UA 0.2 0.2 or 1.0 E.U./dL   Nitrite, UA negative    Leukocytes, UA Small (1+) (A) Negative   Appearance     Odor     No results found.    Assessment and Plan: 67 y.o. female with   Near syncope - Plan: EKG 12-Lead, POCT Glucose (CBG), POCT Urinalysis Dipstick  Elevated blood pressure reading - she is mildly hypertensive in the office today, no orthostasis - random fingerstick glucose 106 today - ECG sinus bradycardia, no change compared to prior from 05/2015 - recommend patient avoid skipping meals, stay hydrated - follow-up with PCP regarding elevated blood pressure readings and anxiety  Patient education and anticipatory guidance given Patient agrees with treatment plan Follow-up as needed if symptoms worsen or fail to improve  Levonne Hubertharley E. Zechariah Bissonnette PA-C

## 2018-03-17 NOTE — Patient Instructions (Signed)
Near-Syncope °Near-syncope is when you suddenly become weak or dizzy, or you feel like you might pass out (faint). During an episode of near-syncope, you may: °· Feel dizzy or light-headed. °· Feel nauseous. °· See all white or all black in your field of vision. °· Have cold, clammy skin. ° °This condition is caused by a sudden decrease in blood flow to the brain. This decrease can result from various causes, but most of those causes are not dangerous. However, near-syncope can be a sign of a serious medical problem, so it is important to seek medical care. °If you fainted, get medical help right away.Call your local emergency services (911 in the [place] Do not drive yourself to the hospital. °Follow these instructions at home: °Pay attention to any changes in your symptoms. Take these actions to help with your condition: °· Have someone stay with you until you feel stable. °· Do not drive, use machinery, or play sports until your health care provider says it is okay. °· Keep all follow-up visits as told by your health care provider. This is important. °· If you start to feel like you might faint, lie down right away and raise (elevate) your feet above the level of your heart. Breathe deeply and steadily. Wait until all of the symptoms have passed. °· Drink enough fluid to keep your urine clear or pale yellow. °· If you are taking blood pressure or heart medicine, get up slowly and take several minutes to sit and then stand. This can reduce dizziness. °· Take over-the-counter and prescription medicines only as told by your health care provider. ° °Get help right away if: °· You have a severe headache. °· You have unusual pain in your chest, abdomen, or back. °· You are bleeding from your mouth or rectum, or you have black or tarry stool. °· You have a very fast or irregular heartbeat (palpitations). °· You faint once or repeatedly. °· You have a seizure. °· You are confused. °· You have trouble walking. °· You have  severe weakness. °· You have vision problems. °These symptoms may represent a serious problem that is an emergency. Do not wait to see if your symptoms will go away. Get medical help right away. Call your local emergency services (911 in the [place] Do not drive yourself to the hospital. °This information is not intended to replace advice given to you by your health care provider. Make sure you discuss any questions you have with your health care provider. °Document Released: 08/23/2005 Document Revised: 01/29/2016 Document Reviewed: 05/07/2015 °Elsevier Interactive Patient Education © 2017 [employer] Inc[employer] ° °

## 2018-04-15 ENCOUNTER — Other Ambulatory Visit: Payer: Self-pay | Admitting: Sports Medicine

## 2018-05-15 ENCOUNTER — Other Ambulatory Visit: Payer: Self-pay | Admitting: Sports Medicine

## 2018-05-18 ENCOUNTER — Ambulatory Visit (INDEPENDENT_AMBULATORY_CARE_PROVIDER_SITE_OTHER): Payer: Medicare Other | Admitting: Sports Medicine

## 2018-05-18 DIAGNOSIS — M170 Bilateral primary osteoarthritis of knee: Secondary | ICD-10-CM | POA: Diagnosis not present

## 2018-05-18 NOTE — Assessment & Plan Note (Signed)
Bilateral knee injection, previous injection was approximately 5 months ago. She has declined Visco supplementation, Monovisc pricing was too high.

## 2018-05-18 NOTE — Progress Notes (Signed)
Subjective:    CC: Bilateral knee pain  HPI: This is a pleasant 67 year old female, for the past couple of weeks she had worsening of knee pain, last injections were about 5-1/2 months ago.  Pain is moderate, persistent, localized anteriorly and medially without radiation, no mechanical symptoms, no trauma.  Oral NSAIDs and analgesics are not effective, we did try some topical Pennsaid (diclofenac 2% topical) which was tremendously helpful but she still has a bit of pain that she would like injections for today.  She was unable to afford Visco supplementation.  I reviewed the past medical history, family history, social history, surgical history, and allergies today and no changes were needed.  Please see the problem list section below in epic for further details.  Past Medical History: Past Medical History:  Diagnosis Date  . Hyperlipidemia   . Hypertension    Past Surgical History: Past Surgical History:  Procedure Laterality Date  . BLADDER SUSPENSION    . KNEE SURGERY Right 3 months ago  . tummy tuck     Social History: Social History   Socioeconomic History  . Marital status: Widowed    Spouse name: Not on file  . Number of children: Not on file  . Years of education: Not on file  . Highest education level: Not on file  Occupational History  . Not on file  Social Needs  . Financial resource strain: Not on file  . Food insecurity:    Worry: Not on file    Inability: Not on file  . Transportation needs:    Medical: Not on file    Non-medical: Not on file  Tobacco Use  . Smoking status: Former Games developer  . Smokeless tobacco: Never Used  Substance and Sexual Activity  . Alcohol use: Yes  . Drug use: No  . Sexual activity: Not on file  Lifestyle  . Physical activity:    Days per week: Not on file    Minutes per session: Not on file  . Stress: Not on file  Relationships  . Social connections:    Talks on phone: Not on file    Gets together: Not on file   Attends religious service: Not on file    Active member of club or organization: Not on file    Attends meetings of clubs or organizations: Not on file    Relationship status: Not on file  Other Topics Concern  . Not on file  Social History Narrative  . Not on file   Family History: Family History  Problem Relation Age of Onset  . Heart attack Father   . Hyperlipidemia Father   . Cancer Sister        breast  . Cancer Maternal Aunt        breast  . Heart attack Paternal Uncle    Allergies: No Known Allergies Medications: See med rec.  Review of Systems: No fevers, chills, night sweats, weight loss, chest pain, or shortness of breath.   Objective:    General: Well Developed, well nourished, and in no acute distress.  Neuro: Alert and oriented x3, extra-ocular muscles intact, sensation grossly intact.  HEENT: Normocephalic, atraumatic, pupils equal round reactive to light, neck supple, no masses, no lymphadenopathy, thyroid nonpalpable.  Skin: Warm and dry, no rashes. Cardiac: Regular rate and rhythm, no murmurs rubs or gallops, no lower extremity edema.  Respiratory: Clear to auscultation bilaterally. Not using accessory muscles, speaking in full sentences. Bilateral knees: Normal to inspection with no erythema  or effusion or obvious bony abnormalities. Minimal tenderness over the patellar facets and medial joint line. ROM normal in flexion and extension and lower leg rotation. Ligaments with solid consistent endpoints including ACL, PCL, LCL, MCL. Negative Mcmurray's and provocative meniscal tests. Non painful patellar compression. Patellar and quadriceps tendons unremarkable. Hamstring and quadriceps strength is normal.  Procedure: Real-time Ultrasound Guided Injection of left knee Device: GE Logiq E  Verbal informed consent obtained.  Time-out conducted.  Noted no overlying erythema, induration, or other signs of local infection.  Skin prepped in a sterile fashion.    Local anesthesia: Topical Ethyl chloride.  With sterile technique and under real time ultrasound guidance: 1 cc Kenalog 40, 2 cc lidocaine, 2 cc bupivacaine injected easily Completed without difficulty  Pain immediately resolved suggesting accurate placement of the medication.  Advised to call if fevers/chills, erythema, induration, drainage, or persistent bleeding.  Images permanently stored and available for review in the ultrasound unit.  Impression: Technically successful ultrasound guided injection.  Procedure: Real-time Ultrasound Guided Injection of right knee Device: GE Logiq E  Verbal informed consent obtained.  Time-out conducted.  Noted no overlying erythema, induration, or other signs of local infection.  Skin prepped in a sterile fashion.  Local anesthesia: Topical Ethyl chloride.  With sterile technique and under real time ultrasound guidance: 1 cc Kenalog 40, 2 cc lidocaine, 2 cc bupivacaine injected easily Completed without difficulty  Pain immediately resolved suggesting accurate placement of the medication.  Advised to call if fevers/chills, erythema, induration, drainage, or persistent bleeding.  Images permanently stored and available for review in the ultrasound unit.  Impression: Technically successful ultrasound guided injection.  Impression and Recommendations:    Primary osteoarthritis of both knees Bilateral knee injection, previous injection was approximately 5 months ago. She has declined Visco supplementation, Monovisc pricing was too high. ___________________________________________ Ihor Austinhomas J. Benjamin Stainhekkekandam, M.D., ABFM., CAQSM. Primary Care and Sports Medicine [facility]  Adjunct Instructor of Family Medicine  [employer]

## 2018-06-01 DIAGNOSIS — H2513 Age-related nuclear cataract, bilateral: Secondary | ICD-10-CM | POA: Diagnosis not present

## 2018-06-01 DIAGNOSIS — H524 Presbyopia: Secondary | ICD-10-CM | POA: Diagnosis not present

## 2018-06-14 ENCOUNTER — Other Ambulatory Visit: Payer: Self-pay | Admitting: Sports Medicine

## 2018-07-12 ENCOUNTER — Other Ambulatory Visit: Payer: Self-pay | Admitting: Sports Medicine

## 2018-07-17 DIAGNOSIS — R109 Unspecified abdominal pain: Secondary | ICD-10-CM | POA: Diagnosis not present

## 2018-07-17 DIAGNOSIS — N941 Unspecified dyspareunia: Secondary | ICD-10-CM | POA: Diagnosis not present

## 2018-07-17 DIAGNOSIS — R87810 Cervical high risk human papillomavirus (HPV) DNA test positive: Secondary | ICD-10-CM | POA: Diagnosis not present

## 2018-07-17 DIAGNOSIS — Z87410 Personal history of cervical dysplasia: Secondary | ICD-10-CM | POA: Diagnosis not present

## 2018-07-17 DIAGNOSIS — R87610 Atypical squamous cells of undetermined significance on cytologic smear of cervix (ASC-US): Secondary | ICD-10-CM | POA: Diagnosis not present

## 2018-07-17 DIAGNOSIS — Z1151 Encounter for screening for human papillomavirus (HPV): Secondary | ICD-10-CM | POA: Diagnosis not present

## 2018-07-17 DIAGNOSIS — N952 Postmenopausal atrophic vaginitis: Secondary | ICD-10-CM | POA: Diagnosis not present

## 2018-07-25 ENCOUNTER — Other Ambulatory Visit: Payer: Self-pay

## 2018-08-11 ENCOUNTER — Other Ambulatory Visit: Payer: Self-pay | Admitting: Sports Medicine

## 2018-08-14 ENCOUNTER — Ambulatory Visit (INDEPENDENT_AMBULATORY_CARE_PROVIDER_SITE_OTHER): Payer: Medicare Other | Admitting: Sports Medicine

## 2018-08-14 DIAGNOSIS — M170 Bilateral primary osteoarthritis of knee: Secondary | ICD-10-CM

## 2018-08-14 NOTE — Progress Notes (Signed)
Subjective:    CC: Knee pain  HPI: Lyndell returns, she has bilateral knee osteoarthritis, worsening recently, previous injection was 3 months ago, pain is moderate, worsening, localized at the medial joint lines in the patellar facets without radiation.  No mechanical symptoms, no trauma.  I reviewed the past medical history, family history, social history, surgical history, and allergies today and no changes were needed.  Please see the problem list section below in epic for further details.  Past Medical History: Past Medical History:  Diagnosis Date  . Hyperlipidemia   . Hypertension    Past Surgical History: Past Surgical History:  Procedure Laterality Date  . BLADDER SUSPENSION    . KNEE SURGERY Right 3 months ago  . tummy tuck     Social History: Social History   Socioeconomic History  . Marital status: Widowed    Spouse name: Not on file  . Number of children: Not on file  . Years of education: Not on file  . Highest education level: Not on file  Occupational History  . Not on file  Social Needs  . Financial resource strain: Not on file  . Food insecurity:    Worry: Not on file    Inability: Not on file  . Transportation needs:    Medical: Not on file    Non-medical: Not on file  Tobacco Use  . Smoking status: Former Games developer  . Smokeless tobacco: Never Used  Substance and Sexual Activity  . Alcohol use: Yes  . Drug use: No  . Sexual activity: Not on file  Lifestyle  . Physical activity:    Days per week: Not on file    Minutes per session: Not on file  . Stress: Not on file  Relationships  . Social connections:    Talks on phone: Not on file    Gets together: Not on file    Attends religious service: Not on file    Active member of club or organization: Not on file    Attends meetings of clubs or organizations: Not on file    Relationship status: Not on file  Other Topics Concern  . Not on file  Social History Narrative  . Not on file    Family History: Family History  Problem Relation Age of Onset  . Heart attack Father   . Hyperlipidemia Father   . Cancer Sister        breast  . Cancer Maternal Aunt        breast  . Heart attack Paternal Uncle    Allergies: No Known Allergies Medications: See med rec.  Review of Systems: No fevers, chills, night sweats, weight loss, chest pain, or shortness of breath.   Objective:    General: Well Developed, well nourished, and in no acute distress.  Neuro: Alert and oriented x3, extra-ocular muscles intact, sensation grossly intact.  HEENT: Normocephalic, atraumatic, pupils equal round reactive to light, neck supple, no masses, no lymphadenopathy, thyroid nonpalpable.  Skin: Warm and dry, no rashes. Cardiac: Regular rate and rhythm, no murmurs rubs or gallops, no lower extremity edema.  Respiratory: Clear to auscultation bilaterally. Not using accessory muscles, speaking in full sentences. Bilateral knees: Normal to inspection with no erythema or effusion or obvious bony abnormalities. Mild tenderness at the patellar facets ROM normal in flexion and extension and lower leg rotation. Ligaments with solid consistent endpoints including ACL, PCL, LCL, MCL. Negative Mcmurray's and provocative meniscal tests. Non painful patellar compression. Patellar and quadriceps tendons unremarkable.  Hamstring and quadriceps strength is normal.  Procedure: Real-time Ultrasound Guided Injection of left knee Device: GE Logiq E  Verbal informed consent obtained.  Time-out conducted.  Noted no overlying erythema, induration, or other signs of local infection.  Skin prepped in a sterile fashion.  Local anesthesia: Topical Ethyl chloride.  With sterile technique and under real time ultrasound guidance: 1 cc Kenalog 40, 2 cc lidocaine, 2 cc bupivacaine injected easily Completed without difficulty  Pain immediately resolved suggesting accurate placement of the medication.  Advised to call  if fevers/chills, erythema, induration, drainage, or persistent bleeding.  Images permanently stored and available for review in the ultrasound unit.  Impression: Technically successful ultrasound guided injection.  Procedure: Real-time Ultrasound Guided Injection of right knee Device: GE Logiq E  Verbal informed consent obtained.  Time-out conducted.  Noted no overlying erythema, induration, or other signs of local infection.  Skin prepped in a sterile fashion.  Local anesthesia: Topical Ethyl chloride.  With sterile technique and under real time ultrasound guidance: 1 cc Kenalog 40, 2 cc lidocaine, 2 cc bupivacaine injected easily Completed without difficulty  Pain immediately resolved suggesting accurate placement of the medication.  Advised to call if fevers/chills, erythema, induration, drainage, or persistent bleeding.  Images permanently stored and available for review in the ultrasound unit.  Impression: Technically successful ultrasound guided injection.  Impression and Recommendations:    Primary osteoarthritis of both knees It is been about 3 months, bilateral knee injections as above. Return as needed. Declines Visco supplementation, Monovisc pricing was too high.  ___________________________________________ Ihor Austinhomas J. Benjamin Stainhekkekandam, M.D., ABFM., CAQSM. Primary Care and Sports Medicine  [facility]  Adjunct Professor of Family Medicine  [employer]

## 2018-08-14 NOTE — Assessment & Plan Note (Signed)
It is been about 3 months, bilateral knee injections as above. Return as needed. Declines Visco supplementation, Monovisc pricing was too high.

## 2018-08-23 DIAGNOSIS — R87810 Cervical high risk human papillomavirus (HPV) DNA test positive: Secondary | ICD-10-CM | POA: Diagnosis not present

## 2018-08-23 DIAGNOSIS — R87610 Atypical squamous cells of undetermined significance on cytologic smear of cervix (ASC-US): Secondary | ICD-10-CM | POA: Diagnosis not present

## 2018-08-23 DIAGNOSIS — N952 Postmenopausal atrophic vaginitis: Secondary | ICD-10-CM | POA: Diagnosis not present

## 2018-08-23 DIAGNOSIS — N888 Other specified noninflammatory disorders of cervix uteri: Secondary | ICD-10-CM | POA: Diagnosis not present

## 2018-08-23 DIAGNOSIS — N882 Stricture and stenosis of cervix uteri: Secondary | ICD-10-CM | POA: Diagnosis not present

## 2018-09-14 ENCOUNTER — Other Ambulatory Visit: Payer: Self-pay | Admitting: Sports Medicine

## 2018-10-13 ENCOUNTER — Other Ambulatory Visit: Payer: Self-pay | Admitting: Sports Medicine

## 2018-10-19 ENCOUNTER — Other Ambulatory Visit: Payer: Self-pay | Admitting: Sports Medicine

## 2018-10-19 ENCOUNTER — Ambulatory Visit (INDEPENDENT_AMBULATORY_CARE_PROVIDER_SITE_OTHER): Payer: Medicare HMO | Admitting: Sports Medicine

## 2018-10-19 DIAGNOSIS — F39 Unspecified mood [affective] disorder: Secondary | ICD-10-CM | POA: Diagnosis not present

## 2018-10-19 NOTE — Assessment & Plan Note (Signed)
Has historically been well controlled on Lexapro. She self discontinued her trazodone and more recently discontinued her Lexapro. She feels stable, she has a normal level of anxiety, no depressive symptoms. She feels as though she can feel her emotions again. Return as needed.

## 2018-10-19 NOTE — Progress Notes (Signed)
Subjective:    CC: Follow-up  HPI: Generalized anxiety: Self discontinued her Lexapro, no increase in depression, she feels normal emotions again, happy with how things are going.  She did need a refill on Ativan.  Her knees are starting to hurt again, she is 2-1/2 months post her last injection.  I reviewed the past medical history, family history, social history, surgical history, and allergies today and no changes were needed.  Please see the problem list section below in epic for further details.  Past Medical History: Past Medical History:  Diagnosis Date  . Hyperlipidemia   . Hypertension    Past Surgical History: Past Surgical History:  Procedure Laterality Date  . BLADDER SUSPENSION    . KNEE SURGERY Right 3 months ago  . tummy tuck     Social History: Social History   Socioeconomic History  . Marital status: Widowed    Spouse name: Not on file  . Number of children: Not on file  . Years of education: Not on file  . Highest education level: Not on file  Occupational History  . Not on file  Social Needs  . Financial resource strain: Not on file  . Food insecurity:    Worry: Not on file    Inability: Not on file  . Transportation needs:    Medical: Not on file    Non-medical: Not on file  Tobacco Use  . Smoking status: Former Games developermoker  . Smokeless tobacco: Never Used  Substance and Sexual Activity  . Alcohol use: Yes  . Drug use: No  . Sexual activity: Not on file  Lifestyle  . Physical activity:    Days per week: Not on file    Minutes per session: Not on file  . Stress: Not on file  Relationships  . Social connections:    Talks on phone: Not on file    Gets together: Not on file    Attends religious service: Not on file    Active member of club or organization: Not on file    Attends meetings of clubs or organizations: Not on file    Relationship status: Not on file  Other Topics Concern  . Not on file  Social History Narrative  . Not on file     Family History: Family History  Problem Relation Age of Onset  . Heart attack Father   . Hyperlipidemia Father   . Cancer Sister        breast  . Cancer Maternal Aunt        breast  . Heart attack Paternal Uncle    Allergies: No Known Allergies Medications: See med rec.  Review of Systems: No fevers, chills, night sweats, weight loss, chest pain, or shortness of breath.   Objective:    General: Well Developed, well nourished, and in no acute distress.  Neuro: Alert and oriented x3, extra-ocular muscles intact, sensation grossly intact.  HEENT: Normocephalic, atraumatic, pupils equal round reactive to light, neck supple, no masses, no lymphadenopathy, thyroid nonpalpable.  Skin: Warm and dry, no rashes. Cardiac: Regular rate and rhythm, no murmurs rubs or gallops, no lower extremity edema.  Respiratory: Clear to auscultation bilaterally. Not using accessory muscles, speaking in full sentences.  Impression and Recommendations:    Mood disorder (HCC) Has historically been well controlled on Lexapro. She self discontinued her trazodone and more recently discontinued her Lexapro. She feels stable, she has a normal level of anxiety, no depressive symptoms. She feels as though she can  feel her emotions again. Return as needed. ___________________________________________ [name]. [name], M.D., ABFM., CAQSM. Primary Care and Sports Medicine [facility]  Adjunct Professor of Family Medicine  [employer]

## 2018-11-16 ENCOUNTER — Other Ambulatory Visit: Payer: Self-pay | Admitting: Sports Medicine

## 2018-11-23 ENCOUNTER — Encounter: Payer: Self-pay | Admitting: Sports Medicine

## 2018-11-23 ENCOUNTER — Ambulatory Visit (INDEPENDENT_AMBULATORY_CARE_PROVIDER_SITE_OTHER): Payer: Medicare HMO | Admitting: Sports Medicine

## 2018-11-23 ENCOUNTER — Other Ambulatory Visit: Payer: Self-pay

## 2018-11-23 DIAGNOSIS — F419 Anxiety disorder, unspecified: Secondary | ICD-10-CM | POA: Diagnosis not present

## 2018-11-23 DIAGNOSIS — M170 Bilateral primary osteoarthritis of knee: Secondary | ICD-10-CM | POA: Diagnosis not present

## 2018-11-23 DIAGNOSIS — F329 Major depressive disorder, single episode, unspecified: Secondary | ICD-10-CM | POA: Diagnosis not present

## 2018-11-23 DIAGNOSIS — F32A Depression, unspecified: Secondary | ICD-10-CM

## 2018-11-23 MED ORDER — ESCITALOPRAM OXALATE 20 MG PO TABS: 10.0000 mg | ORAL_TABLET | Freq: Every day | ORAL | 3 refills | Status: DC

## 2018-11-23 NOTE — Assessment & Plan Note (Signed)
She self discontinued her Lexapro, cried all the time and then restarted.  Needs a refill.

## 2018-11-23 NOTE — Progress Notes (Signed)
Subjective:    CC: Bilateral knee pain  HPI: This is a pleasant 68 year old female, for the past several weeks she is had worsening knee pain, bilateral, localized to the joint lines without radiation.  No trauma, no clinical symptoms.  Oral NSAIDs are not effective.  Anxiety depression: She self discontinued her Lexapro, cried all the time and then restarted.  Needs a refill.  No suicidal or homicidal ideation.  I reviewed the past medical history, family history, social history, surgical history, and allergies today and no changes were needed.  Please see the problem list section below in epic for further details.  Past Medical History: Past Medical History:  Diagnosis Date  . Hyperlipidemia   . Hypertension    Past Surgical History: Past Surgical History:  Procedure Laterality Date  . BLADDER SUSPENSION    . KNEE SURGERY Right 3 months ago  . tummy tuck     Social History: Social History   Socioeconomic History  . Marital status: Widowed    Spouse name: Not on file  . Number of children: Not on file  . Years of education: Not on file  . Highest education level: Not on file  Occupational History  . Not on file  Social Needs  . Financial resource strain: Not on file  . Food insecurity:    Worry: Not on file    Inability: Not on file  . Transportation needs:    Medical: Not on file    Non-medical: Not on file  Tobacco Use  . Smoking status: Former Games developer  . Smokeless tobacco: Never Used  Substance and Sexual Activity  . Alcohol use: Yes  . Drug use: No  . Sexual activity: Not on file  Lifestyle  . Physical activity:    Days per week: Not on file    Minutes per session: Not on file  . Stress: Not on file  Relationships  . Social connections:    Talks on phone: Not on file    Gets together: Not on file    Attends religious service: Not on file    Active member of club or organization: Not on file    Attends meetings of clubs or organizations: Not on file     Relationship status: Not on file  Other Topics Concern  . Not on file  Social History Narrative  . Not on file   Family History: Family History  Problem Relation Age of Onset  . Heart attack Father   . Hyperlipidemia Father   . Cancer Sister        breast  . Cancer Maternal Aunt        breast  . Heart attack Paternal Uncle    Allergies: No Known Allergies Medications: See med rec.  Review of Systems: No fevers, chills, night sweats, weight loss, chest pain, or shortness of breath.   Objective:    General: Well Developed, well nourished, and in no acute distress.  Neuro: Alert and oriented x3, extra-ocular muscles intact, sensation grossly intact.  HEENT: Normocephalic, atraumatic, pupils equal round reactive to light, neck supple, no masses, no lymphadenopathy, thyroid nonpalpable.  Skin: Warm and dry, no rashes. Cardiac: Regular rate and rhythm, no murmurs rubs or gallops, no lower extremity edema.  Respiratory: Clear to auscultation bilaterally. Not using accessory muscles, speaking in full sentences. Knee: Normal to inspection with no erythema or effusion or obvious bony abnormalities. Palpation normal with no warmth or joint line tenderness or patellar tenderness or condyle tenderness. ROM  normal in flexion and extension and lower leg rotation. Ligaments with solid consistent endpoints including ACL, PCL, LCL, MCL. Negative Mcmurray's and provocative meniscal tests. Non painful patellar compression. Patellar and quadriceps tendons unremarkable. Hamstring and quadriceps strength is normal.  Procedure: Real-time Ultrasound Guided injection of the left knee Device: GE Logiq E  Verbal informed consent obtained.  Time-out conducted.  Noted no overlying erythema, induration, or other signs of local infection.  Skin prepped in a sterile fashion.  Local anesthesia: Topical Ethyl chloride.  With sterile technique and under real time ultrasound guidance:  1 cc Kenalog 40,  2 cc lidocaine, 2 cc bupivacaine injected easily Completed without difficulty  Pain immediately resolved suggesting accurate placement of the medication.  Advised to call if fevers/chills, erythema, induration, drainage, or persistent bleeding.  Images permanently stored and available for review in the ultrasound unit.  Impression: Technically successful ultrasound guided injection.   Procedure: Real-time Ultrasound Guided injection of the right knee Device: GE Logiq E  Verbal informed consent obtained.  Time-out conducted.  Noted no overlying erythema, induration, or other signs of local infection.  Skin prepped in a sterile fashion.  Local anesthesia: Topical Ethyl chloride.  With sterile technique and under real time ultrasound guidance:  1 cc Kenalog 40, 2 cc lidocaine, 2 cc bupivacaine injected easily Completed without difficulty  Pain immediately resolved suggesting accurate placement of the medication.  Advised to call if fevers/chills, erythema, induration, drainage, or persistent bleeding.  Images permanently stored and available for review in the ultrasound unit.  Impression: Technically successful ultrasound guided injection.   Impression and Recommendations:    Primary osteoarthritis of both knees Previous injection was recurrence of pain, moderate, worsening, localized to the joint lines. Bilateral injections today. We will get x-rays at future visit.  Anxiety and depression She self discontinued her Lexapro, cried all the time and then restarted.  Needs a refill.   ___________________________________________ [name]. [name], M.D., ABFM., CAQSM. Primary Care and Sports Medicine [facility]  Adjunct Professor of Family Medicine  [employer]

## 2018-11-23 NOTE — Assessment & Plan Note (Signed)
Previous injection was recurrence of pain, moderate, worsening, localized to the joint lines. Bilateral injections today. We will get x-rays at future visit.

## 2018-12-15 ENCOUNTER — Other Ambulatory Visit: Payer: Self-pay | Admitting: Sports Medicine

## 2019-01-12 ENCOUNTER — Other Ambulatory Visit: Payer: Self-pay | Admitting: Sports Medicine

## 2019-02-14 ENCOUNTER — Other Ambulatory Visit: Payer: Self-pay | Admitting: Sports Medicine

## 2019-02-14 NOTE — Telephone Encounter (Signed)
Please advise on refill.

## 2019-02-23 ENCOUNTER — Ambulatory Visit (INDEPENDENT_AMBULATORY_CARE_PROVIDER_SITE_OTHER): Payer: Medicare HMO | Admitting: Sports Medicine

## 2019-02-23 DIAGNOSIS — F419 Anxiety disorder, unspecified: Secondary | ICD-10-CM | POA: Diagnosis not present

## 2019-02-23 DIAGNOSIS — F329 Major depressive disorder, single episode, unspecified: Secondary | ICD-10-CM | POA: Diagnosis not present

## 2019-02-23 DIAGNOSIS — M170 Bilateral primary osteoarthritis of knee: Secondary | ICD-10-CM | POA: Diagnosis not present

## 2019-02-23 DIAGNOSIS — F32A Depression, unspecified: Secondary | ICD-10-CM

## 2019-02-23 MED ORDER — HYDROXYZINE HCL 25 MG PO TABS: 25.0000 mg | ORAL_TABLET | ORAL | 0 refills | Status: DC | PRN

## 2019-02-23 NOTE — Assessment & Plan Note (Addendum)
Bilateral knee injection. I have again advised her to consider Orthovisc, I am going to get her approved. Injections are not providing 3 months of relief.

## 2019-02-23 NOTE — Progress Notes (Signed)
Subjective:    CC: Knee pain, anxiety  HPI: Maria Li is a very pleasant 68 year old female, we have been treating her for anxiety depression, symptoms are historically well controlled on 10 mg of Lexapro.  Since the quarantine she has been alone, she feels more sad, tearful sometimes, no suicidal or homicidal ideation.  She self increased her Lexapro to a full 20 mg tab and has noticed some improvement.  In addition she has known bilateral knee osteoarthritis, we have done several injections, the most recent of which was 3 months ago, we did discuss Visco supplementation in the recent past and she declined due to time limitations.  Pain is moderate, persistent, localized at the medial joint line without radiation, trauma, no mechanical symptoms.  I reviewed the past medical history, family history, social history, surgical history, and allergies today and no changes were needed.  Please see the problem list section below in epic for further details.  Past Medical History: Past Medical History:  Diagnosis Date  . Hyperlipidemia   . Hypertension    Past Surgical History: Past Surgical History:  Procedure Laterality Date  . BLADDER SUSPENSION    . KNEE SURGERY Right 3 months ago  . tummy tuck     Social History: Social History   Socioeconomic History  . Marital status: Widowed    Spouse name: Not on file  . Number of children: Not on file  . Years of education: Not on file  . Highest education level: Not on file  Occupational History  . Not on file  Social Needs  . Financial resource strain: Not on file  . Food insecurity    Worry: Not on file    Inability: Not on file  . Transportation needs    Medical: Not on file    Non-medical: Not on file  Tobacco Use  . Smoking status: Former Research scientist (life sciences)  . Smokeless tobacco: Never Used  Substance and Sexual Activity  . Alcohol use: Yes  . Drug use: No  . Sexual activity: Not on file  Lifestyle  . Physical activity    Days per week:  Not on file    Minutes per session: Not on file  . Stress: Not on file  Relationships  . Social Herbalist on phone: Not on file    Gets together: Not on file    Attends religious service: Not on file    Active member of club or organization: Not on file    Attends meetings of clubs or organizations: Not on file    Relationship status: Not on file  Other Topics Concern  . Not on file  Social History Narrative  . Not on file   Family History: Family History  Problem Relation Age of Onset  . Heart attack Father   . Hyperlipidemia Father   . Cancer Sister        breast  . Cancer Maternal Aunt        breast  . Heart attack Paternal Uncle    Allergies: No Known Allergies Medications: See med rec.  Review of Systems: No fevers, chills, night sweats, weight loss, chest pain, or shortness of breath.   Objective:    General: Well Developed, well nourished, and in no acute distress.  Neuro: Alert and oriented x3, extra-ocular muscles intact, sensation grossly intact.  HEENT: Normocephalic, atraumatic, pupils equal round reactive to light, neck supple, no masses, no lymphadenopathy, thyroid nonpalpable.  Skin: Warm and dry, no rashes. Cardiac: Regular  rate and rhythm, no murmurs rubs or gallops, no lower extremity edema.  Respiratory: Clear to auscultation bilaterally. Not using accessory muscles, speaking in full sentences. Bilateral knees: Normal to inspection with no erythema or effusion or obvious bony abnormalities. Tender to palpation of the medial joint line. ROM normal in flexion and extension and lower leg rotation. Ligaments with solid consistent endpoints including ACL, PCL, LCL, MCL. Negative Mcmurray's and provocative meniscal tests. Non painful patellar compression. Patellar and quadriceps tendons unremarkable. Hamstring and quadriceps strength is normal.  Procedure: Real-time Ultrasound Guided injection of the left knee Device: GE Logiq E  Verbal  informed consent obtained.  Time-out conducted.  Noted no overlying erythema, induration, or other signs of local infection.  Skin prepped in a sterile fashion.  Local anesthesia: Topical Ethyl chloride.  With sterile technique and under real time ultrasound guidance:  1 cc Kenalog 40, 2 cc lidocaine, 2 cc bupivacaine injected easily Completed without difficulty  Pain immediately resolved suggesting accurate placement of the medication.  Advised to call if fevers/chills, erythema, induration, drainage, or persistent bleeding.  Images permanently stored and available for review in the ultrasound unit.  Impression: Technically successful ultrasound guided injection.  Procedure: Real-time Ultrasound Guided injection of the right knee Device: GE Logiq E  Verbal informed consent obtained.  Time-out conducted.  Noted no overlying erythema, induration, or other signs of local infection.  Skin prepped in a sterile fashion.  Local anesthesia: Topical Ethyl chloride.  With sterile technique and under real time ultrasound guidance:  1 cc Kenalog 40, 2 cc lidocaine, 2 cc bupivacaine injected easily Completed without difficulty  Pain immediately resolved suggesting accurate placement of the medication.  Advised to call if fevers/chills, erythema, induration, drainage, or persistent bleeding.  Images permanently stored and available for review in the ultrasound unit.  Impression: Technically successful ultrasound guided injection.  Impression and Recommendations:    Primary osteoarthritis of both knees Bilateral knee injection. I have again advised her to consider [facility], I am going to get her approved. Injections are not providing 3 months of relief.  Anxiety and depression Increasing anxiety and depression with the current quarantine, she does feel lonely. She self increased her Lexapro to a full 20 mg tablet which I think is appropriate. Return as needed for this.    ___________________________________________ Ihor Austinhomas J. Benjamin Stainhekkekandam, M.D., ABFM., CAQSM. Primary Care and Sports Medicine [facility]  Adjunct Professor of Family Medicine  [employer]

## 2019-02-23 NOTE — Assessment & Plan Note (Signed)
Increasing anxiety and depression with the current quarantine, she does feel lonely. She self increased her Lexapro to a full 20 mg tablet which I think is appropriate. Return as needed for this.

## 2019-03-15 ENCOUNTER — Telehealth: Payer: Self-pay | Admitting: Sports Medicine

## 2019-03-15 ENCOUNTER — Other Ambulatory Visit: Payer: Self-pay | Admitting: Sports Medicine

## 2019-03-15 NOTE — Telephone Encounter (Signed)
-----   Message from Tasia Catchings, [place] sent at 03/05/2019  1:52 PM EDT ----- Patient does not want to get the injections at this time and wants to wait 3 months.  ----- Message ----- From: Tasia Catchings, CMA Sent: 02/26/2019  10:34 AM EDT To: Tasia Catchings, CMA  Information has been submitted to [facility] and awaiting determination.   ----- Message ----- From: Silverio Decamp, MD Sent: 02/23/2019  10:58 AM EDT To: Beatris Ship Ismahan Lippman, CMA  Orthovisc approval, both knees.  Failed steroid injections, NSAIDs, x-ray confirmed. ___________________________________________ Gwen Her. Dianah Field, M.D., ABFM., CAQSM. Primary Care and Sports Medicine [place] [facility]  Adjunct Professor of [place] [employer] [facility] [employer]

## 2019-04-13 ENCOUNTER — Other Ambulatory Visit: Payer: Self-pay | Admitting: Sports Medicine

## 2019-05-14 ENCOUNTER — Other Ambulatory Visit: Payer: Self-pay | Admitting: Sports Medicine

## 2019-05-18 ENCOUNTER — Other Ambulatory Visit: Payer: Self-pay | Admitting: Sports Medicine

## 2019-06-11 ENCOUNTER — Ambulatory Visit (INDEPENDENT_AMBULATORY_CARE_PROVIDER_SITE_OTHER): Payer: Medicare HMO | Admitting: Sports Medicine

## 2019-06-11 ENCOUNTER — Encounter: Payer: Self-pay | Admitting: Sports Medicine

## 2019-06-11 ENCOUNTER — Other Ambulatory Visit: Payer: Self-pay

## 2019-06-11 DIAGNOSIS — F419 Anxiety disorder, unspecified: Secondary | ICD-10-CM

## 2019-06-11 DIAGNOSIS — M170 Bilateral primary osteoarthritis of knee: Secondary | ICD-10-CM | POA: Diagnosis not present

## 2019-06-11 DIAGNOSIS — F329 Major depressive disorder, single episode, unspecified: Secondary | ICD-10-CM

## 2019-06-11 DIAGNOSIS — F32A Depression, unspecified: Secondary | ICD-10-CM

## 2019-06-11 MED ORDER — LORAZEPAM 2 MG PO TABS: ORAL_TABLET | ORAL | 0 refills | Status: DC

## 2019-06-11 NOTE — Assessment & Plan Note (Signed)
Continue Lexapro, refilling Ativan, number 60/month.

## 2019-06-11 NOTE — Progress Notes (Signed)
Subjective:    CC: Follow-up  HPI: Maria Li returns, she is a pleasant 68 year old female with bilateral knee osteoarthritis, she has a recurrence of pain, last injections were over 3 months ago.  She has declined Visco supplementation in the past, pain is recurrent, moderate, persistent, localized to the joint lines without radiation.  I reviewed the past medical history, family history, social history, surgical history, and allergies today and no changes were needed.  Please see the problem list section below in epic for further details.  Past Medical History: Past Medical History:  Diagnosis Date  . Hyperlipidemia   . Hypertension    Past Surgical History: Past Surgical History:  Procedure Laterality Date  . BLADDER SUSPENSION    . KNEE SURGERY Right 3 months ago  . tummy tuck     Social History: Social History   Socioeconomic History  . Marital status: Widowed    Spouse name: Not on file  . Number of children: Not on file  . Years of education: Not on file  . Highest education level: Not on file  Occupational History  . Not on file  Social Needs  . Financial resource strain: Not on file  . Food insecurity    Worry: Not on file    Inability: Not on file  . Transportation needs    Medical: Not on file    Non-medical: Not on file  Tobacco Use  . Smoking status: Former Games developer  . Smokeless tobacco: Never Used  Substance and Sexual Activity  . Alcohol use: Yes  . Drug use: No  . Sexual activity: Not on file  Lifestyle  . Physical activity    Days per week: Not on file    Minutes per session: Not on file  . Stress: Not on file  Relationships  . Social Musician on phone: Not on file    Gets together: Not on file    Attends religious service: Not on file    Active member of club or organization: Not on file    Attends meetings of clubs or organizations: Not on file    Relationship status: Not on file  Other Topics Concern  . Not on file  Social  History Narrative  . Not on file   Family History: Family History  Problem Relation Age of Onset  . Heart attack Father   . Hyperlipidemia Father   . Cancer Sister        breast  . Cancer Maternal Aunt        breast  . Heart attack Paternal Uncle    Allergies: No Active Allergies Medications: See med rec.  Review of Systems: No fevers, chills, night sweats, weight loss, chest pain, or shortness of breath.   Objective:    General: Well Developed, well nourished, and in no acute distress.  Neuro: Alert and oriented x3, extra-ocular muscles intact, sensation grossly intact.  HEENT: Normocephalic, atraumatic, pupils equal round reactive to light, neck supple, no masses, no lymphadenopathy, thyroid nonpalpable.  Skin: Warm and dry, no rashes. Cardiac: Regular rate and rhythm, no murmurs rubs or gallops, no lower extremity edema.  Respiratory: Clear to auscultation bilaterally. Not using accessory muscles, speaking in full sentences. Knee: Normal to inspection with no erythema or effusion or obvious bony abnormalities. Palpation normal with no warmth or joint line tenderness or patellar tenderness or condyle tenderness. ROM normal in flexion and extension and lower leg rotation. Ligaments with solid consistent endpoints including ACL, PCL, LCL,  MCL. Negative Mcmurray's and provocative meniscal tests. Non painful patellar compression. Patellar and quadriceps tendons unremarkable. Hamstring and quadriceps strength is normal.  Procedure: Real-time Ultrasound Guided injection of the left knee Device: GE Logiq E  Verbal informed consent obtained.  Time-out conducted.  Noted no overlying erythema, induration, or other signs of local infection.  Skin prepped in a sterile fashion.  Local anesthesia: Topical Ethyl chloride.  With sterile technique and under real time ultrasound guidance:  1 cc Kenalog 40, 2 cc lidocaine, 2 cc bupivacaine injected easily Completed without difficulty   Pain immediately resolved suggesting accurate placement of the medication.  Advised to call if fevers/chills, erythema, induration, drainage, or persistent bleeding.  Images permanently stored and available for review in the ultrasound unit.  Impression: Technically successful ultrasound guided injection.  Procedure: Real-time Ultrasound Guided injection of the right knee Device: GE Logiq E  Verbal informed consent obtained.  Time-out conducted.  Noted no overlying erythema, induration, or other signs of local infection.  Skin prepped in a sterile fashion.  Local anesthesia: Topical Ethyl chloride.  With sterile technique and under real time ultrasound guidance:  1 cc Kenalog 40, 2 cc lidocaine, 2 cc bupivacaine injected easily Completed without difficulty  Pain immediately resolved suggesting accurate placement of the medication.  Advised to call if fevers/chills, erythema, induration, drainage, or persistent bleeding.  Images permanently stored and available for review in the ultrasound unit.  Impression: Technically successful ultrasound guided injection.  Impression and Recommendations:    Primary osteoarthritis of both knees Patient declines viscosupplementation, repeat steroid injection bilaterally today. Next step if this does not provide 3 months of relief would be PRP, we would do 1 shot in each knee weekly x2.  Anxiety and depression Continue Lexapro, refilling Ativan, number 60/month.   ___________________________________________ Gwen Her. Dianah Field, M.D., ABFM., CAQSM. Primary Care and Sports Medicine [facility]  Adjunct Professor of [place] [employer] [facility] [employer]

## 2019-06-11 NOTE — Assessment & Plan Note (Signed)
Patient declines viscosupplementation, repeat steroid injection bilaterally today. Next step if this does not provide 3 months of relief would be PRP, we would do 1 shot in each knee weekly x2.

## 2019-07-26 ENCOUNTER — Encounter: Payer: Self-pay | Admitting: Family Medicine

## 2019-07-26 ENCOUNTER — Ambulatory Visit (INDEPENDENT_AMBULATORY_CARE_PROVIDER_SITE_OTHER): Payer: Medicare HMO | Admitting: Family Medicine

## 2019-07-26 DIAGNOSIS — Z20828 Contact with and (suspected) exposure to other viral communicable diseases: Secondary | ICD-10-CM | POA: Diagnosis not present

## 2019-07-26 DIAGNOSIS — Z20822 Contact with and (suspected) exposure to covid-19: Secondary | ICD-10-CM

## 2019-07-26 NOTE — Progress Notes (Signed)
Virtual Visit via Telephone Note  I connected with Maria Li on 07/26/19 at 11:30 AM EST by telephone and verified that I am speaking with the correct person using two identifiers.   I discussed the limitations, risks, security and privacy concerns of performing an evaluation and management service by telephone and the availability of in person appointments. I also discussed with the patient that there may be a patient responsible charge related to this service. The patient expressed understanding and agreed to proceed.   Subjective:    CC: Possible Covid exposure.  HPI:  68 year old female reports that about 10 days ago she went to [facility] in with a friend.  About 3 days after they got back her friends started complaining that she was not feeling well.  Unfortunately her symptoms progressed over the last week and her friend went to get tested for Covid yesterday and the test came back positive she had a rapid test performed.  Her friend has had a cough fever and chills and body aches.  Maria Li has not had any symptoms though she does report she has a little bit of runny nose and nasal congestion after she drinks her coffee in the morning.  Last known contact with this friend was about 10 days ago likely around the time that she actually contracted Covid.  She would like to get tested and noted that she is negative for before being around her grandchildren.  Past medical history, Surgical history, Family history not pertinant except as noted below, Social history, Allergies, and medications have been entered into the medical record, reviewed, and corrections made.   Review of Systems: No fevers, chills, night sweats, weight loss, chest pain, or shortness of breath.   Objective:    General: Speaking clearly in complete sentences without any shortness of breath.  Alert and oriented x3.  Normal judgment. No apparent acute distress.    Impression and Recommendations:   Covid  exposure- Recommend patient come in for drive-by self swab.  Did let her know that she would have to self quarantine until her results are back.  Hopefully will be negative.  Did discuss warning signs and symptoms for which to be further evaluated.  Patient is agreeable to coming in to be swabbed.     I discussed the assessment and treatment plan with the patient. The patient was provided an opportunity to ask questions and all were answered. The patient agreed with the plan and demonstrated an understanding of the instructions.   The patient was advised to call back or seek an in-person evaluation if the symptoms worsen or if the condition fails to improve as anticipated.  I provided 15 minutes of non-face-to-face time during this encounter.   [name], MD

## 2019-07-28 ENCOUNTER — Encounter: Payer: Self-pay | Admitting: Family Medicine

## 2019-07-28 LAB — NOVEL CORONAVIRUS, NAA: SARS-CoV-2, NAA: NOT DETECTED

## 2019-08-01 ENCOUNTER — Other Ambulatory Visit: Payer: Self-pay | Admitting: Sports Medicine

## 2019-08-01 DIAGNOSIS — F32A Depression, unspecified: Secondary | ICD-10-CM

## 2019-08-01 DIAGNOSIS — F329 Major depressive disorder, single episode, unspecified: Secondary | ICD-10-CM

## 2019-08-01 DIAGNOSIS — F419 Anxiety disorder, unspecified: Secondary | ICD-10-CM

## 2019-08-01 MED ORDER — ESCITALOPRAM OXALATE 20 MG PO TABS: 20.0000 mg | ORAL_TABLET | Freq: Every day | ORAL | 3 refills | Status: DC

## 2019-09-06 ENCOUNTER — Other Ambulatory Visit: Payer: Self-pay | Admitting: Sports Medicine

## 2019-09-06 DIAGNOSIS — F32A Depression, unspecified: Secondary | ICD-10-CM

## 2019-09-06 DIAGNOSIS — F329 Major depressive disorder, single episode, unspecified: Secondary | ICD-10-CM

## 2019-09-06 DIAGNOSIS — F419 Anxiety disorder, unspecified: Secondary | ICD-10-CM

## 2019-09-06 MED ORDER — LORAZEPAM 2 MG PO TABS: ORAL_TABLET | ORAL | 0 refills | Status: DC

## 2019-09-25 ENCOUNTER — Other Ambulatory Visit: Payer: Medicare HMO

## 2019-10-15 ENCOUNTER — Ambulatory Visit: Payer: Medicare HMO

## 2019-11-02 ENCOUNTER — Other Ambulatory Visit: Payer: Self-pay | Admitting: Sports Medicine

## 2019-11-02 DIAGNOSIS — F419 Anxiety disorder, unspecified: Secondary | ICD-10-CM

## 2019-11-02 DIAGNOSIS — F329 Major depressive disorder, single episode, unspecified: Secondary | ICD-10-CM

## 2019-11-06 ENCOUNTER — Encounter: Payer: Self-pay | Admitting: Sports Medicine

## 2019-11-06 ENCOUNTER — Other Ambulatory Visit: Payer: Self-pay

## 2019-11-06 ENCOUNTER — Ambulatory Visit (INDEPENDENT_AMBULATORY_CARE_PROVIDER_SITE_OTHER): Payer: Medicare HMO | Admitting: Sports Medicine

## 2019-11-06 DIAGNOSIS — M170 Bilateral primary osteoarthritis of knee: Secondary | ICD-10-CM

## 2019-11-06 NOTE — Assessment & Plan Note (Signed)
Patient declined viscosupplementation in the past, the last time I injected her was in October 2020. She is now starting to have a recurrence of pain, this is a 74-month response to the previous injections, repeat bilateral injections today. Return to see me as needed, if she does not get 3 months then the neck step still PRP.

## 2019-11-06 NOTE — Progress Notes (Signed)
    Procedures performed today:    Procedure: Real-time Ultrasound Guided injection of the left knee Device: Samsung HS60  Verbal informed consent obtained.  Time-out conducted.  Noted no overlying erythema, induration, or other signs of local infection.  Skin prepped in a sterile fashion.  Local anesthesia: Topical Ethyl chloride.  With sterile technique and under real time ultrasound guidance: 1 cc Kenalog 40, 2 cc lidocaine, 2 cc bupivacaine injected easily Completed without difficulty  Pain immediately resolved suggesting accurate placement of the medication.  Advised to call if fevers/chills, erythema, induration, drainage, or persistent bleeding.  Images permanently stored and available for review in the ultrasound unit.  Impression: Technically successful ultrasound guided injection.  Procedure: Real-time Ultrasound Guided injection of the right knee Device: Samsung HS60  Verbal informed consent obtained.  Time-out conducted.  Noted no overlying erythema, induration, or other signs of local infection.  Skin prepped in a sterile fashion.  Local anesthesia: Topical Ethyl chloride.  With sterile technique and under real time ultrasound guidance: 1 cc Kenalog 40, 2 cc lidocaine, 2 cc bupivacaine injected easily Completed without difficulty  Pain immediately resolved suggesting accurate placement of the medication.  Advised to call if fevers/chills, erythema, induration, drainage, or persistent bleeding.  Images permanently stored and available for review in the ultrasound unit.  Impression: Technically successful ultrasound guided injection.  Independent interpretation of notes and tests performed by another provider:   None.  Impression and Recommendations:    Primary osteoarthritis of both knees Patient declined viscosupplementation in the past, the last time I injected her was in October 2020. She is now starting to have a recurrence of pain, this is a 9-month response  to the previous injections, repeat bilateral injections today. Return to see me as needed, if she does not get 3 months then the neck step still PRP.    ___________________________________________ [name]. [name], M.D., ABFM., CAQSM. Primary Care and Sports Medicine [facility]  Adjunct Instructor of Family Medicine  [employer]

## 2019-12-31 ENCOUNTER — Other Ambulatory Visit: Payer: Self-pay | Admitting: Sports Medicine

## 2019-12-31 DIAGNOSIS — F419 Anxiety disorder, unspecified: Secondary | ICD-10-CM

## 2019-12-31 DIAGNOSIS — F329 Major depressive disorder, single episode, unspecified: Secondary | ICD-10-CM

## 2020-01-02 ENCOUNTER — Ambulatory Visit: Payer: Medicare HMO | Admitting: Sports Medicine

## 2020-02-05 ENCOUNTER — Ambulatory Visit: Payer: Medicare HMO | Admitting: Sports Medicine

## 2020-02-19 ENCOUNTER — Ambulatory Visit (INDEPENDENT_AMBULATORY_CARE_PROVIDER_SITE_OTHER): Payer: Medicare HMO | Admitting: Sports Medicine

## 2020-02-19 ENCOUNTER — Other Ambulatory Visit: Payer: Self-pay

## 2020-02-19 ENCOUNTER — Encounter: Payer: Self-pay | Admitting: Sports Medicine

## 2020-02-19 DIAGNOSIS — M170 Bilateral primary osteoarthritis of knee: Secondary | ICD-10-CM | POA: Diagnosis not present

## 2020-02-19 DIAGNOSIS — H539 Unspecified visual disturbance: Secondary | ICD-10-CM | POA: Diagnosis not present

## 2020-02-19 NOTE — Progress Notes (Signed)
    Procedures performed today:    Procedure: Real-time Ultrasound Guided injection of the left knee Device: Samsung HS60  Verbal informed consent obtained.  Time-out conducted.  Noted no overlying erythema, induration, or other signs of local infection.  Skin prepped in a sterile fashion.  Local anesthesia: Topical Ethyl chloride.  With sterile technique and under real time ultrasound guidance: 1 cc Kenalog 40, 2 cc lidocaine, 2 cc bupivacaine injected easily Completed without difficulty  Pain immediately resolved suggesting accurate placement of the medication.  Advised to call if fevers/chills, erythema, induration, drainage, or persistent bleeding.  Images permanently stored and available for review in the ultrasound unit.  Impression: Technically successful ultrasound guided injection.  Procedure: Real-time Ultrasound Guided injection of the right knee Device: Samsung HS60  Verbal informed consent obtained.  Time-out conducted.  Noted no overlying erythema, induration, or other signs of local infection.  Skin prepped in a sterile fashion.  Local anesthesia: Topical Ethyl chloride.  With sterile technique and under real time ultrasound guidance: 1 cc Kenalog 40, 2 cc lidocaine, 2 cc bupivacaine injected easily Completed without difficulty  Pain immediately resolved suggesting accurate placement of the medication.  Advised to call if fevers/chills, erythema, induration, drainage, or persistent bleeding.  Images permanently stored and available for review in the ultrasound unit.  Impression: Technically successful ultrasound guided injection.  Independent interpretation of notes and tests performed by another provider:   None.  Brief History, Exam, Impression, and Recommendations:    Primary osteoarthritis of both knees Maria Li returns, she is a very pleasant 69 year old female, she has declined viscosupplementation, she gets bilateral knee steroid injections 3-4 times a  year, last injection was in March, repeat bilateral injections today, for recurrence of pain. We can still certainly consider PRP injections in the future.  Visual changes Maria Li did report some vague abnormalities in both of her visual fields at one point that lasted a few minutes and then disappeared, she had a few white spots that obscured her vision, no preceding or postceeding headaches, no other focal neurologic symptoms, no trauma, and the symptoms have not recurred. I would like her to touch base with Dr. Doristine Section downstairs, as she does need a regular optometrist, and she can return to see me as needed for this.    ___________________________________________ [name]. [name], M.D., ABFM., CAQSM. Primary Care and Sports Medicine [place]  Adjunct Instructor of Family Medicine  [employer]

## 2020-02-19 NOTE — Assessment & Plan Note (Signed)
Stella did report some vague abnormalities in both of her visual fields at one point that lasted a few minutes and then disappeared, she had a few white spots that obscured her vision, no preceding or postceeding headaches, no other focal neurologic symptoms, no trauma, and the symptoms have not recurred. I would like her to touch base with Dr. Doristine Section downstairs, as she does need a regular optometrist, and she can return to see me as needed for this.

## 2020-02-19 NOTE — Assessment & Plan Note (Signed)
Maria Li returns, she is a very pleasant 70 year old female, she has declined viscosupplementation, she gets bilateral knee steroid injections 3-4 times a year, last injection was in March, repeat bilateral injections today, for recurrence of pain. We can still certainly consider PRP injections in the future.

## 2020-02-28 ENCOUNTER — Other Ambulatory Visit: Payer: Self-pay | Admitting: Sports Medicine

## 2020-02-28 DIAGNOSIS — F32A Depression, unspecified: Secondary | ICD-10-CM

## 2020-02-28 NOTE — Telephone Encounter (Signed)
Medication refill request Last OV FOR MOOD 10-19-18 patient has been seen in office for injections 02/19/2020  Last refill- 12/31/2019

## 2020-04-09 ENCOUNTER — Other Ambulatory Visit: Payer: Self-pay | Admitting: *Deleted

## 2020-04-09 DIAGNOSIS — F419 Anxiety disorder, unspecified: Secondary | ICD-10-CM

## 2020-04-09 MED ORDER — LORAZEPAM 2 MG PO TABS: ORAL_TABLET | ORAL | 0 refills | Status: DC

## 2020-04-09 NOTE — Telephone Encounter (Signed)
Pt called today complaining of worsening anxiety.  She's only been doing a half tab of her Lexapro up until 3 days ago. I advised her to continue the full dose and to do a f/u with you in 4-6 weeks.  In the meantime, she needs a refill of her Lorazepam.

## 2020-04-09 NOTE — Telephone Encounter (Signed)
Thank you for your excellent documentation, the plan is exactly as I would have wanted.  Well-done Dr. Hessie Diener.

## 2020-04-09 NOTE — Assessment & Plan Note (Signed)
Maria Li has only been doing a half tab of her Lexapro with uncontrolled anxiety, she will increase to a full tab and I am happy to refill her alprazolam.  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

## 2020-04-28 ENCOUNTER — Ambulatory Visit: Payer: Medicare HMO | Admitting: Sports Medicine

## 2020-04-29 ENCOUNTER — Ambulatory Visit: Payer: Medicare HMO | Admitting: Sports Medicine

## 2020-05-07 ENCOUNTER — Ambulatory Visit (INDEPENDENT_AMBULATORY_CARE_PROVIDER_SITE_OTHER): Payer: Medicare HMO | Admitting: Sports Medicine

## 2020-05-07 ENCOUNTER — Encounter: Payer: Self-pay | Admitting: Sports Medicine

## 2020-05-07 DIAGNOSIS — R3915 Urgency of urination: Secondary | ICD-10-CM

## 2020-05-07 DIAGNOSIS — F329 Major depressive disorder, single episode, unspecified: Secondary | ICD-10-CM | POA: Diagnosis not present

## 2020-05-07 DIAGNOSIS — F32A Depression, unspecified: Secondary | ICD-10-CM

## 2020-05-07 DIAGNOSIS — E785 Hyperlipidemia, unspecified: Secondary | ICD-10-CM | POA: Diagnosis not present

## 2020-05-07 DIAGNOSIS — F419 Anxiety disorder, unspecified: Secondary | ICD-10-CM

## 2020-05-07 MED ORDER — FLUOXETINE HCL 10 MG PO CAPS: 10.0000 mg | ORAL_CAPSULE | Freq: Every day | ORAL | 3 refills | Status: DC

## 2020-05-07 MED ORDER — MIRABEGRON ER 25 MG PO TB24: 25.0000 mg | ORAL_TABLET | Freq: Every day | ORAL | 3 refills | Status: DC

## 2020-05-07 NOTE — Assessment & Plan Note (Signed)
This is a very pleasant 69 year old female, she has known anxiety, historically this has been well controlled with high-dose Lexapro, as well as lorazepam. I think she is noticing some tachyphylaxis with Lexapro so we will down taper, I am going to switch to Prozac. Return to see me in 6 weeks for PHQ and GAD. I am also going to add some behavioral therapy.

## 2020-05-07 NOTE — Assessment & Plan Note (Addendum)
It is likely Maria Li has overactive bladder, she is post bladder sling surgery, she was also seen by her OB/GYN, it sounds as though she had a urinalysis suggestive of a UTI and was prescribed Keflex. Unfortunately her symptoms have not improved. We are going to check another urinalysis with culture and I am going to add Myrbetriq. Return to see me in 4 weeks to see how this is working.  The patient has already been treated for a urinary tract infection with Keflex, cultures were negative at the time with an outside provider.  Today she continues to have leukocytes, as well as calcium oxalate, we started treatment for overactive bladder, I am going to go ahead and pull the trigger for urinary tract ultrasound.  We need to make sure there are no stones, I am going to hold off on antibiotics until I see culture results.

## 2020-05-07 NOTE — Progress Notes (Addendum)
    Procedures performed today:    None.  Independent interpretation of notes and tests performed by another provider:   None.  Brief History, Exam, Impression, and Recommendations:    Anxiety and depression This is a very pleasant 69 year old female, she has known anxiety, historically this has been well controlled with high-dose Lexapro, as well as lorazepam. I think she is noticing some tachyphylaxis with Lexapro so we will down taper, I am going to switch to Prozac. Return to see me in 6 weeks for PHQ and GAD. I am also going to add some behavioral therapy.  Urinary urgency It is likely Tiasha has overactive bladder, she is post bladder sling surgery, she was also seen by her OB/GYN, it sounds as though she had a urinalysis suggestive of a UTI and was prescribed Keflex. Unfortunately her symptoms have not improved. We are going to check another urinalysis with culture and I am going to add Myrbetriq. Return to see me in 4 weeks to see how this is working.  The patient has already been treated for a urinary tract infection with Keflex, cultures were negative at the time with an outside provider.  Today she continues to have leukocytes, as well as calcium oxalate, we started treatment for overactive bladder, I am going to go ahead and pull the trigger for urinary tract ultrasound.  We need to make sure there are no stones, I am going to hold off on antibiotics until I see culture results.    ___________________________________________ [name]. [name], M.D., ABFM., CAQSM. Primary Care and Sports Medicine [facility]  Adjunct Instructor of Family Medicine  [employer]

## 2020-05-08 LAB — COMPLETE METABOLIC PANEL WITH GFR
AG Ratio: 2.4 (calc) (ref 1.0–2.5)
ALT: 13 U/L (ref 6–29)
AST: 14 U/L (ref 10–35)
Albumin: 4.5 g/dL (ref 3.6–5.1)
Alkaline phosphatase (APISO): 66 U/L (ref 37–153)
BUN: 12 mg/dL (ref 7–25)
CO2: 30 mmol/L (ref 20–32)
Calcium: 9.7 mg/dL (ref 8.6–10.4)
Chloride: 99 mmol/L (ref 98–110)
Creat: 0.79 mg/dL (ref 0.60–0.93)
GFR, Est African American: 88 mL/min/{1.73_m2} (ref >=60)
GFR, Est Non African American: 76 mL/min/{1.73_m2} (ref >=60)
Globulin: 1.9 g/dL (calc) (ref 1.9–3.7)
Glucose, Bld: 111 mg/dL (ref 65–139)
Potassium: 4.2 mmol/L (ref 3.5–5.3)
Sodium: 134 mmol/L — ABNORMAL LOW (ref 135–146)
Total Bilirubin: 0.4 mg/dL (ref 0.2–1.2)
Total Protein: 6.4 g/dL (ref 6.1–8.1)

## 2020-05-08 LAB — LIPID PANEL W/REFLEX DIRECT LDL
Cholesterol: 143 mg/dL (ref <200)
HDL: 75 mg/dL (ref >=50)
LDL Cholesterol (Calc): 55 mg/dL (calc)
Non-HDL Cholesterol (Calc): 68 mg/dL (calc) (ref <130)
Total CHOL/HDL Ratio: 1.9 (calc) (ref <5.0)
Triglycerides: 54 mg/dL (ref <150)

## 2020-05-08 LAB — CBC
HCT: 38.2 % (ref 35.0–45.0)
Hemoglobin: 12.9 g/dL (ref 11.7–15.5)
MCH: 31.5 pg (ref 27.0–33.0)
MCHC: 33.8 g/dL (ref 32.0–36.0)
MCV: 93.2 fL (ref 80.0–100.0)
MPV: 9.1 fL (ref 7.5–12.5)
Platelets: 298 10*3/uL (ref 140–400)
RBC: 4.1 10*6/uL (ref 3.80–5.10)
RDW: 11.9 % (ref 11.0–15.0)
WBC: 5.3 10*3/uL (ref 3.8–10.8)

## 2020-05-08 LAB — TSH: TSH: 1.85 mIU/L (ref 0.40–4.50)

## 2020-05-08 NOTE — Addendum Note (Signed)
Addended by: Monica Becton on: 05/08/2020 09:54 PM   Modules accepted: Orders

## 2020-05-09 LAB — URINALYSIS W MICROSCOPIC + REFLEX CULTURE
Bacteria, UA: NONE SEEN /HPF
Bilirubin Urine: NEGATIVE
Glucose, UA: NEGATIVE
Hgb urine dipstick: NEGATIVE
Hyaline Cast: NONE SEEN /LPF
Nitrites, Initial: NEGATIVE
Protein, ur: NEGATIVE
RBC / HPF: NONE SEEN /HPF (ref 0–2)
Specific Gravity, Urine: 1.016 (ref 1.001–1.03)
Squamous Epithelial / HPF: NONE SEEN /HPF (ref <=5)
WBC, UA: NONE SEEN /HPF (ref 0–5)
pH: 6.5 (ref 5.0–8.0)

## 2020-05-09 LAB — URINE CULTURE
MICRO NUMBER:: 10903083
Result:: NO GROWTH
SPECIMEN QUALITY:: ADEQUATE

## 2020-05-09 LAB — CULTURE INDICATED

## 2020-05-13 ENCOUNTER — Other Ambulatory Visit: Payer: Self-pay

## 2020-05-13 ENCOUNTER — Ambulatory Visit (INDEPENDENT_AMBULATORY_CARE_PROVIDER_SITE_OTHER): Payer: Medicare HMO

## 2020-05-13 DIAGNOSIS — R3915 Urgency of urination: Secondary | ICD-10-CM | POA: Diagnosis not present

## 2020-05-14 ENCOUNTER — Telehealth: Payer: Self-pay | Admitting: *Deleted

## 2020-05-14 DIAGNOSIS — R3915 Urgency of urination: Secondary | ICD-10-CM

## 2020-05-14 MED ORDER — SOLIFENACIN SUCCINATE 5 MG PO TABS: 5.0000 mg | ORAL_TABLET | Freq: Every day | ORAL | 3 refills | Status: DC

## 2020-05-14 NOTE — Telephone Encounter (Signed)
Pt left vm wanting you to know that the Myrbetriq is $250 and wanted to know if you could send her something cheaper in.

## 2020-05-14 NOTE — Telephone Encounter (Signed)
Switching to [employer]

## 2020-05-15 NOTE — Telephone Encounter (Signed)
Pt notified of rx. 

## 2020-05-21 ENCOUNTER — Telehealth: Payer: Self-pay

## 2020-05-21 NOTE — Telephone Encounter (Signed)
Patient called, states she is having some body aches and just overall not feeling well. She has been urinating a lot, wanting to know if her blood sugar was normal. Advised her of most recent lab results. Patient states she just feels concerned because she does not feel right. She is in [place] with her daughter right now because she does not feel well.   I advised patient to go to local urgent care for evaluation. Patient agreeable with plan and will seek care.

## 2020-05-22 NOTE — Telephone Encounter (Signed)
Patient called, states she still isn't feeling well. Thinks she may need a CT scan. Still hasn't been seen. Asked my recommendation, advised her my recommendation was still to be evaluated at an urgent care.  Patient agreeable, states she will get seen this time.

## 2020-05-28 ENCOUNTER — Other Ambulatory Visit: Payer: Self-pay | Admitting: Sports Medicine

## 2020-05-28 DIAGNOSIS — F419 Anxiety disorder, unspecified: Secondary | ICD-10-CM

## 2020-05-29 ENCOUNTER — Other Ambulatory Visit: Payer: Self-pay | Admitting: Sports Medicine

## 2020-05-29 DIAGNOSIS — F419 Anxiety disorder, unspecified: Secondary | ICD-10-CM

## 2020-06-04 ENCOUNTER — Ambulatory Visit: Payer: Medicare HMO | Admitting: Sports Medicine

## 2020-06-11 ENCOUNTER — Ambulatory Visit: Payer: Medicare HMO | Admitting: Sports Medicine

## 2020-06-17 ENCOUNTER — Other Ambulatory Visit: Payer: Self-pay | Admitting: Sports Medicine

## 2020-06-17 DIAGNOSIS — F419 Anxiety disorder, unspecified: Secondary | ICD-10-CM

## 2020-06-25 ENCOUNTER — Ambulatory Visit: Payer: Medicare HMO | Admitting: Sports Medicine

## 2020-06-26 ENCOUNTER — Telehealth: Payer: Self-pay

## 2020-06-26 ENCOUNTER — Ambulatory Visit (INDEPENDENT_AMBULATORY_CARE_PROVIDER_SITE_OTHER): Payer: Medicare HMO | Admitting: Sports Medicine

## 2020-06-26 ENCOUNTER — Encounter: Payer: Self-pay | Admitting: Sports Medicine

## 2020-06-26 ENCOUNTER — Other Ambulatory Visit: Payer: Self-pay

## 2020-06-26 DIAGNOSIS — R3915 Urgency of urination: Secondary | ICD-10-CM

## 2020-06-26 DIAGNOSIS — F419 Anxiety disorder, unspecified: Secondary | ICD-10-CM

## 2020-06-26 DIAGNOSIS — F32A Depression, unspecified: Secondary | ICD-10-CM | POA: Diagnosis not present

## 2020-06-26 MED ORDER — DULOXETINE HCL 60 MG PO CPEP: 60.0000 mg | ORAL_CAPSULE | Freq: Every day | ORAL | 3 refills | Status: DC

## 2020-06-26 MED ORDER — BUPROPION HCL ER (XL) 150 MG PO TB24: 150.0000 mg | ORAL_TABLET | ORAL | 3 refills | Status: DC

## 2020-06-26 NOTE — Assessment & Plan Note (Signed)
Maria Li is persistently depressed, she has had some suicidal thoughts but has no ideation, no plan, contracts for safety, and has had no prior attempts. She was on max dose Lexapro with tachyphylaxis so we switched to Prozac, she did not like this and switch back to Lexapro which did not work to begin with. Discontinue Lexapro, starting Cymbalta at 60 mg with Wellbutrin at 150 mg, she does have a behavioral therapy appointment scheduled next month. Return to see me in 1 month for repeat PHQ and GAD.

## 2020-06-26 NOTE — Assessment & Plan Note (Signed)
Maria Li returns, she is a pleasant 69 year old female, she has a history of a bladder sling, she has been seen by her OB/GYN, has had several urinalyses, some of these show leukocytes and bacteria, she has been treated multiple times with antibiotics including Keflex and more recently saw urgent care and her urologist, and has a prescription for Septra. We obtained a urinary tract ultrasound that was negative. It sounds like CT scan is planned as well as cystoscopy. Unfortunately Myrbetriq made her nauseated. Certainly if all of the above including urodynamics are unremarkable we may consider other anticholinergic overactive bladder type medications in conjunction with urology.

## 2020-06-26 NOTE — Telephone Encounter (Signed)
Patient called concerned about her medication change. She wants to know if she needs to taper down on the lexapro, or if she just stops it and starts cymbalta & wellbutrin combo.  Please advise

## 2020-06-26 NOTE — Progress Notes (Signed)
    Procedures performed today:    None.  Independent interpretation of notes and tests performed by another provider:   None.  Brief History, Exam, Impression, and Recommendations:    Urinary urgency Maria Li returns, she is a pleasant 69 year old female, she has a history of a bladder sling, she has been seen by her OB/GYN, has had several urinalyses, some of these show leukocytes and bacteria, she has been treated multiple times with antibiotics including Keflex and more recently saw urgent care and her urologist, and has a prescription for Septra. We obtained a urinary tract ultrasound that was negative. It sounds like CT scan is planned as well as cystoscopy. Unfortunately Myrbetriq made her nauseated. Certainly if all of the above including urodynamics are unremarkable we may consider other anticholinergic overactive bladder type medications in conjunction with urology.  Anxiety and depression Maria Li is persistently depressed, she has had some suicidal thoughts but has no ideation, no plan, contracts for safety, and has had no prior attempts. She was on max dose Lexapro with tachyphylaxis so we switched to Prozac, she did not like this and switch back to Lexapro which did not work to begin with. Discontinue Lexapro, starting Cymbalta at 60 mg with Wellbutrin at 150 mg, she does have a behavioral therapy appointment scheduled next month. Return to see me in 1 month for repeat PHQ and GAD.    ___________________________________________ [name]. [name], M.D., ABFM., CAQSM. Primary Care and Sports Medicine [facility]  Adjunct Instructor of Family Medicine  [employer]

## 2020-06-27 NOTE — Telephone Encounter (Signed)
It will be okay

## 2020-06-27 NOTE — Telephone Encounter (Signed)
Left pt msg advising of recommendation

## 2020-06-28 ENCOUNTER — Other Ambulatory Visit: Payer: Self-pay | Admitting: Sports Medicine

## 2020-06-28 DIAGNOSIS — F32A Depression, unspecified: Secondary | ICD-10-CM

## 2020-06-28 DIAGNOSIS — F419 Anxiety disorder, unspecified: Secondary | ICD-10-CM

## 2020-07-02 ENCOUNTER — Telehealth: Payer: Self-pay

## 2020-07-02 DIAGNOSIS — R413 Other amnesia: Secondary | ICD-10-CM

## 2020-07-02 NOTE — Telephone Encounter (Signed)
Call from [facility] regarding the patient.  She reports the patient is having episodes of memory loss and even forgetting to take her meds.  Duwayne Heck is concerned about early onset dementia.  She would like to have patient assessed.  Do you do that or do you need to refer her somewhere else? Duwayne Heck stated that the patient is in denial of the situation and doesn't think anything is wrong.  Family is concerned.

## 2020-07-03 DIAGNOSIS — R413 Other amnesia: Secondary | ICD-10-CM | POA: Insufficient documentation

## 2020-07-03 NOTE — Assessment & Plan Note (Signed)
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 We will have neurology coevaluate.

## 2020-07-03 NOTE — Telephone Encounter (Signed)
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  We can certainly have neurology evaluate her.  I will go ahead and place the referral.  She also will need a full serum work-up as well as a brain MRI as an initial work-up for potential dementia.

## 2020-07-03 NOTE — Telephone Encounter (Signed)
Spoke with Duwayne Heck, she is aware of the referrals and additional tests that have been ordered.  She will talk with patient about having these done to verify that it is just pseudodementia.

## 2020-07-03 NOTE — Telephone Encounter (Signed)
Left message for Maria Li to return call to office.

## 2020-07-15 ENCOUNTER — Telehealth: Payer: Self-pay | Admitting: Sports Medicine

## 2020-07-15 NOTE — Telephone Encounter (Signed)
Attempted peer-to-peer, insurance company denied brain MRI.  Patient has the option to pay cash.

## 2020-07-18 ENCOUNTER — Other Ambulatory Visit: Payer: Self-pay | Admitting: Sports Medicine

## 2020-07-18 DIAGNOSIS — F32A Depression, unspecified: Secondary | ICD-10-CM

## 2020-07-24 ENCOUNTER — Ambulatory Visit: Payer: Medicare HMO | Admitting: Sports Medicine

## 2020-08-03 ENCOUNTER — Other Ambulatory Visit: Payer: Self-pay | Admitting: Sports Medicine

## 2020-08-03 DIAGNOSIS — F32A Depression, unspecified: Secondary | ICD-10-CM

## 2020-08-15 ENCOUNTER — Other Ambulatory Visit: Payer: Self-pay | Admitting: Sports Medicine

## 2020-08-15 DIAGNOSIS — F419 Anxiety disorder, unspecified: Secondary | ICD-10-CM

## 2020-09-14 ENCOUNTER — Other Ambulatory Visit: Payer: Self-pay | Admitting: Sports Medicine

## 2020-09-14 DIAGNOSIS — F32A Depression, unspecified: Secondary | ICD-10-CM

## 2020-09-14 NOTE — Telephone Encounter (Signed)
Last written 08/04/2020 #60 no refills Last appt 06/26/2020

## 2020-09-16 ENCOUNTER — Other Ambulatory Visit: Payer: Self-pay | Admitting: Sports Medicine

## 2020-09-16 DIAGNOSIS — F419 Anxiety disorder, unspecified: Secondary | ICD-10-CM

## 2020-09-18 ENCOUNTER — Ambulatory Visit: Payer: Medicare HMO | Admitting: Neurology

## 2020-09-23 ENCOUNTER — Other Ambulatory Visit: Payer: Self-pay | Admitting: Sports Medicine

## 2020-09-23 DIAGNOSIS — F419 Anxiety disorder, unspecified: Secondary | ICD-10-CM

## 2020-09-24 NOTE — Telephone Encounter (Signed)
Last sent 09/15/2020 #60 no refills

## 2020-11-06 ENCOUNTER — Ambulatory Visit: Payer: Medicare HMO | Admitting: Sports Medicine

## 2020-11-25 ENCOUNTER — Other Ambulatory Visit: Payer: Self-pay | Admitting: Sports Medicine

## 2020-11-25 DIAGNOSIS — F32A Depression, unspecified: Secondary | ICD-10-CM

## 2020-11-25 DIAGNOSIS — F419 Anxiety disorder, unspecified: Secondary | ICD-10-CM

## 2020-12-03 NOTE — Progress Notes (Signed)
[facility]    Provider:  Dr Lucia Gaskins Requesting Provider: Monica Becton,* Primary Care Provider:  Monica Becton, MD  CC: Memory loss  HPI:  Maria Li is a 70 y.o. female here as requested by Monica Becton,* for Memory loss.  Past medical history hypertension, hyperlipidemia, uncontrolled anxiety and depression.  She has a family history of Alzheimer's in her mother and maternal aunt and maternal grandmother.  I reviewed primary care provider's notes: Patient has anxiety and depression, she is persistently depressed, she has had some suicidal thoughts but no ideation or plan and no prior attempts, she was on max dose of Lexapro but switched to Prozac and back to Lexapro started Cymbalta with Wellbutrin.  She is seeing behavioral therapy.  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  I reviewed the chart showed that it does not appear she completed MRI of the brain.  Multiple labs were ordered including RPR, B12, sed rate, CBC, TSH, folate, copper, IFE and CMP and those also have not been completed work-up was ordered at the end of October 2021.  Also chart shows she canceled an earlier new patient appointment in our practice.  It appears she had a recent consult with internal medicine at [facility], also diagnosed with anxiety and depression, I do not see any behavioral health records.  Patient is here with her daughter who also reports much information.  She reports memory loss.  Patient made some financial decisions like closing her bank account she did not remember closing in the spring of last year.  She still drives and denies getting lost.  Daughter states there is still significant memory changes.  Patient lives alone.  Patient feels the pandemic might estimates to do with it because she was by herself or so long. Daughter says they  have been working with a psychiatrist for her anxiety and depression. She thinks it started during the pandemic because she didn't get out, she still cooks and she remembers all the recipes, she can follow recipes as well without difficulty, still very social, Daughter says she went out during the pandemic a lot. She has lost 20 pounds over the last 6-9 months, she didn;t eat, she sits in bed and watches TV. She used to exercise more and she keeps saying she needs to join the [employer] again. She manages her own finances, daughter says she was having trouble such as she wrote a check on a closed account she forgot, she thought she sent her estimated taxes and didn;t, what she used to do once a once she was struggling with. mother is having a hard time with passwords and then she gets locked out and forgets the password. Medications had to change to an automatic dispenser and she forgets taking her meds. She is repeating questions in the same phone conversations.   Reviewed notes, labs and imaging from outside physicians, which showed: see above  Review of Systems: Patient complains of symptoms per HPI as well as the following symptoms: depression,anxiety,memory loss. Pertinent negatives and positives per HPI. All others negative.   Social History   Socioeconomic History  . Marital status: Widowed    Spouse name: Not on file  . Number of children: 1  . Years of education: beauty course   . Highest education level: High school graduate  Occupational History  . Not on file  Tobacco Use  .  Smoking status: Former Games developer  . Smokeless tobacco: Never Used  Substance and Sexual Activity  . Alcohol use: Yes    Comment: seldom, glass of wine   . Drug use: No  . Sexual activity: Not on file  Other Topics Concern  . Not on file  Social History Narrative   Lives alone    Right handed   Caffeine: decaf    Social Determinants of Health   Financial Resource Strain: Not on file  Food Insecurity: Not on  file  Transportation Needs: Not on file  Physical Activity: Not on file  Stress: Not on file  Social Connections: Not on file  Intimate Partner Violence: Not on file    Family History  Problem Relation Age of Onset  . Heart attack Father   . Hyperlipidemia Father   . Alzheimer's disease Father   . Cancer Sister        breast  . Cancer Maternal Aunt        breast  . Heart attack Paternal Uncle   . Alzheimer's disease Mother   . Alzheimer's disease Maternal Grandmother   . Alzheimer's disease Maternal Aunt     Past Medical History:  Diagnosis Date  . Hyperlipidemia   . Hypertension     Patient Active Problem List   Diagnosis Date Noted  . Memory loss 07/03/2020  . Urinary urgency 05/07/2020  . Visual changes 02/19/2020  . Elevated blood pressure reading 03/17/2018  . Near syncope 03/17/2018  . Left low back pain 08/07/2015  . Primary osteoarthritis of both knees 03/28/2014  . Annual physical exam 03/28/2014  . Anxiety and depression 03/28/2014  . Hyperlipidemia 03/28/2014  . Insomnia 03/28/2014    Past Surgical History:  Procedure Laterality Date  . BLADDER SUSPENSION    . tummy tuck      Current Outpatient Medications  Medication Sig Dispense Refill  . CALCIUM PO Take 10 mg by mouth.    . DULoxetine (CYMBALTA) 60 MG capsule TAKE 1 CAPSULE BY MOUTH EVERY DAY (Patient taking differently: 2 (two) times daily.) 90 capsule 3  . hydrOXYzine (ATARAX/VISTARIL) 25 MG tablet TAKE 1 TABLET (25 MG TOTAL) BY MOUTH AS NEEDED FOR ITCHING. 90 tablet 0  . lamoTRIgine (LAMICTAL) 25 MG tablet Take 50 mg by mouth daily.    Marland Kitchen LORazepam (ATIVAN) 0.5 MG tablet Take 0.5 mg by mouth daily as needed.    . rosuvastatin (CRESTOR) 10 MG tablet Take 10 mg by mouth daily.     No current facility-administered medications for this visit.    Allergies as of 12/04/2020  . (No Known Allergies)    Vitals: BP 122/76 (BP Location: Right Arm, Patient Position: Sitting)   Pulse 68   Ht 5'  5.5" (1.664 m)   Wt 125 lb (56.7 kg)   BMI 20.48 kg/m  Last Weight:  Wt Readings from Last 1 Encounters:  12/04/20 125 lb (56.7 kg)   Last Height:   Ht Readings from Last 1 Encounters:  12/04/20 5' 5.5" (1.664 m)     Physical exam: Exam: Gen: NAD, conversant, well nourised, well groomed                     CV: RRR, no MRG. No Carotid Bruits. No peripheral edema, warm, nontender Eyes: Conjunctivae clear without exudates or hemorrhage  Neuro: Detailed Neurologic Exam  Speech:    Speech is normal; fluent and spontaneous with normal comprehension.  Cognition:    The patient is oriented  to person, place, and time;     recent and remote memory intact;     language fluent;     normal attention, concentration,     fund of knowledge Cranial Nerves:    The pupils are equal, round, and reactive to light. The fundi are flat. Visual fields are full to finger confrontation. Extraocular movements are intact. Trigeminal sensation is intact and the muscles of mastication are normal. The face is symmetric. The palate elevates in the midline. Hearing intact. Voice is normal. Shoulder shrug is normal. The tongue has normal motion without fasciculations.   Coordination:    Normal finger to nose   Gait:    Heel-toe and tandem gait are norma with mild imbalance   Motor Observation:    No asymmetry, no atrophy, and no involuntary movements noted. Tone:    Normal muscle tone.    Posture:    Posture is normal.     Strength:    Strength is V/V in the upper and lower limbs.      Sensation: intact to LT     Reflex Exam:  DTR's:    Deep tendon reflexes in the upper and lower extremities are brisk bilaterally.   Toes:    The toes are downgoing bilaterally.   Clonus:    Clonus is absent.    Assessment/Plan:  Significant anxiety and depression. Daughter had to take over finances due to mistakes mother was making, mother is having a hard time with passwords and then she gets locked out  and forgets the password. Medications had to change to an automatic dispenser and she forgets taking her meds. She is repeating questions in the same phone conversations. She forgot an appointment she had 3 weeks ago. Relying more on GPS. She has a significant family history of Alzheimer's and I think a thorough workup is warranted.   Orders Placed This Encounter  Procedures  . MR BRAIN W WO CONTRAST  . B12 and Folate Panel  . Methylmalonic acid, serum  . Vitamin B1  . Homocysteine  . TSH  . CBC with Differential/Platelets  . Comprehensive metabolic panel  . Vitamin D, 25-hydroxy  . RPR  . Ambulatory referral to Neuropsychology   No orders of the defined types were placed in this encounter.   Cc: Curly Shores, MD  Naomie Dean, MD  [facility] [address] [place], [place] [address]  Phone [phone] Fax [phone]

## 2020-12-04 ENCOUNTER — Telehealth: Payer: Self-pay | Admitting: Neurology

## 2020-12-04 ENCOUNTER — Ambulatory Visit: Payer: Medicare HMO | Admitting: Neurology

## 2020-12-04 ENCOUNTER — Other Ambulatory Visit: Payer: Self-pay

## 2020-12-04 ENCOUNTER — Encounter: Payer: Self-pay | Admitting: Neurology

## 2020-12-04 VITALS — BP 122/76 | HR 68 | Ht 65.5 in | Wt 125.0 lb

## 2020-12-04 DIAGNOSIS — Z820 Family history of epilepsy and other diseases of the nervous system: Secondary | ICD-10-CM

## 2020-12-04 DIAGNOSIS — R413 Other amnesia: Secondary | ICD-10-CM | POA: Diagnosis not present

## 2020-12-04 DIAGNOSIS — R4189 Other symptoms and signs involving cognitive functions and awareness: Secondary | ICD-10-CM

## 2020-12-04 NOTE — Telephone Encounter (Signed)
[employer] medicare order sent to [facility]. They will obtain the auth and reach out to the patient to schedule.  °

## 2020-12-04 NOTE — Patient Instructions (Addendum)
MRI of the brain Formal memory testing Blood work FDG PET Scan  Recommendations to prevent or slow progression of cognitive decline:   Exercise You should increase exercise 30 to 45 minutes per day at least 3 days a week although 5 to 7 would be preferred. Any type of exercise (including walking) is acceptable although a recumbent bicycle may be best if you are unsteady. Disease related apathy can be a significant roadblock to exercise and the only way to overcome this is to make it a daily routine and perhaps have a reward at the end (something your loved one loves to eat or drink perhaps) or a personal trainer coming to the home can also be very useful. In general a structured, repetitive schedule is best.   Cardiovascular Health: You should optimize all cardiovascular risk factors (blood pressure, sugar, cholesterol) as vascular disease such as strokes and heart attacks can make memory problems much worse.   Diet: Eating a heart healthy (Mediterranean) diet is also a good idea; fish and poultry instead of red meat, nuts (mostly non-peanuts), vegetables, fruits, olive oil or canola oil (instead of butter), minimal salt (use other spices to flavor foods), whole grain rice, bread, cereal and pasta and wine in moderation.  General Health: Any diseases which effect your body will effect your brain such as a pneumonia, urinary infection, blood clot, heart attack or stroke. Keep contact with your primary care doctor for regular follow ups.  Sleep. A good nights sleep is healthy for the brain. Seven hours is recommended. If you have insomnia or poor sleep habits see the recommendations below  Tips: Structured and consistent daytime and nighttime routine, including regular wake times, bedtimes, and mealtimes, will be important for the patient to avoid confusion. Keeping frequently used items in designated places will help reduce stress from searching. If there are worries about getting lost do not let the  patient leave home unaccompanied. They might benefit from wearing an identification bracelet that will help others assist in finding home if they become lost. Information about nationwide safe return services and other helpful resources may be obtained through the Alzheimer's Association helpline at [phone].  Finances, Power of Restaurant manager, fast food Directives: You should consider putting legal safeguards in place with regard to financial and medical decision making. While the spouse always has power of attorney for medical and financial issues in the absence of any form, you should consider what you want in case the spouse / caregiver is no longer around or capable of making decisions.   [place] : MovieEvening.com.au.pdf  Or Google "[facility]" AND "An Proofreader for [employer]  Other States: PainBasics.com.au  The signature on these forms should be notarized.   DRIVING:   Driving only during the day Drive only to familiar Locations Avoid driving during bad weather  If you would like to be tested to see if you are driving safely, [facility] has a Clinical Driving Evaluation. To schedule an appointment call [phone].                RESOURCES:  Memory Loss: Improve your short term memory By Laverle Patter  The Alzheimer's Reading Room [email or web address]   The Alzheimer's Compendium http://www.alzcompend.info/  [employer] www.dukefamilysupport.OAC [phone]  Recommended resources for caregivers (All can be purchased on [employer]):  1) A Caregiver's Guide to Dementia: Using Activities and Other Strategies to Prevent, Reduce and Manage Behavioral Symptoms by Mahala Menghini. Bethena Midget and [employer]   2) A Caregiver's  Guide to Lewy Body Dementia by Briant Sites MS BSN and Velia Meyer   3) What If It's Not Alzheimer's?: A Caregiver's Guide to Dementia by Neila Gear (Author), Roberts Gaudy (Editor)  3) The 36 hour day by Rabins and Mace  4) Understanding Difficult Behaviors by Gerre Scull and White  Online course for helping caregivers reduce stress, guilt and frustration called the Caregivers Helpbook. The website is www.powerfultoolsforcaregivers.org  As a caregiver you are a Government social research officer. Problems you face as a caregiver are usually unique to your situation and the way your loved-one's disease manifests itself. The best way to use these books is to look at the Table of Contents and read any chapters of interest or that apply to challenges you are having as a caregiver.  NATIONAL RESOURCES: For more information on neurological disorders or research programs funded by the [employer] of Neurological Disorders and Stroke, contact the Institute's Gaffer (BRAIN) at: BRAIN [address] [place] [address] [phone] (toll-free) [employer]Automobile.co.uk  Information on dementia is also available from the following organizations: Alzheimer's Disease Education and Referral (ADEAR) Center [employer] on Aging [address] [place] [address] [phone] (toll-free) CashCowGambling.be  Alzheimer's Association [address] [place], [place] [address] [phone] (toll-free, 24-hour helpline) [phone] (TDD) LimitLaws.hu  Alzheimer's Foundation of [place] [address] [place], [place] [address] [phone] (toll-free) www.alzfdn.org  Alzheimer's Drug Discovery Foundation [address], [address] [place], [place] [address] [phone] www.alzdiscovery.org  Association for Frontotemporal Degeneration [employer] [address] [place], [place] [address] [phone] (toll-free) www.theaftd.[facility]  [facility]  Foundation [address] [place], [place] [address] [phone] (toll-free) www.brightfocus.org/alzheimers  [facility] [address] [place], [place] [address] [phone] www.BushWebsites.nl  Lewy Body Dementia Association [address], [place], [place] [address] [phone] (toll-free LBD Caregiver Link) www.lbda.[facility] [address] [place],  [address] [phone] (toll-free) [phone] [facility]) http://www.maynard.net/  National Organization for Rare Disorders [address] [place], [place] [address] 1-117-356-[phone]) (toll-free) www.rarediseases.org  The Dementias: Hope Through Research was jointly produced by the [employer] of Neurological Disorders and Stroke (NINDS) and the [employer] on Aging (NIA), both part of the [employer], the [employer] research agency--supporting scientific studies that turn discovery into health. NINDS is the nation's leading funder of research on the brain and nervous system. The NINDS mission is to reduce the burden of neurological disease. For more information and resources, visit ToledoAutomobile.co.uk [1] or call [phone]. NIA leads the federal government effort conducting and supporting research on aging and the health and well-being of older people. NIA's Alzheimer's Disease Education and Referral (ADEAR) Center offers information and publications on dementia and caregiving for families, caregivers, and professionals. For more information, visit CashCowGambling.be [2] or call [phone]. Also available from NIA are publications and information about Alzheimer's disease as well as the booklets Frontotemporal Disorders: Information for Patients, Families, and Caregivers and Lewy Body Dementia: Information for Patients, Families, and  Professionals. Source URL: VoiceZap.is

## 2020-12-05 ENCOUNTER — Encounter: Payer: Self-pay | Admitting: Neurology

## 2020-12-15 ENCOUNTER — Other Ambulatory Visit: Payer: Self-pay

## 2020-12-15 ENCOUNTER — Ambulatory Visit
Admission: RE | Admit: 2020-12-15 | Discharge: 2020-12-15 | Disposition: A | Discharge status: Home / Self Care | Payer: Medicare HMO | Source: Ambulatory Visit | Attending: Neurology | Admitting: Neurology

## 2020-12-15 DIAGNOSIS — R413 Other amnesia: Secondary | ICD-10-CM

## 2020-12-15 DIAGNOSIS — R4189 Other symptoms and signs involving cognitive functions and awareness: Secondary | ICD-10-CM

## 2020-12-15 DIAGNOSIS — Z820 Family history of epilepsy and other diseases of the nervous system: Secondary | ICD-10-CM

## 2020-12-15 MED ORDER — GADOBENATE DIMEGLUMINE 529 MG/ML IV SOLN
10.0000 mL | Freq: Once | INTRAVENOUS | Status: AC | PRN
  Administered 2020-12-15: 10 mL via INTRAVENOUS

## 2020-12-16 LAB — CBC WITH DIFFERENTIAL/PLATELET
Basophils Absolute: 0.1 10*3/uL (ref 0.0–0.2)
Basos: 1 %
EOS (ABSOLUTE): 0.1 10*3/uL (ref 0.0–0.4)
Eos: 2 %
Hematocrit: 42.2 % (ref 34.0–46.6)
Hemoglobin: 13.9 g/dL (ref 11.1–15.9)
Immature Grans (Abs): 0 10*3/uL (ref 0.0–0.1)
Immature Granulocytes: 0 %
Lymphocytes Absolute: 0.9 10*3/uL (ref 0.7–3.1)
Lymphs: 16 %
MCH: 31 pg (ref 26.6–33.0)
MCHC: 32.9 g/dL (ref 31.5–35.7)
MCV: 94 fL (ref 79–97)
Monocytes Absolute: 0.6 10*3/uL (ref 0.1–0.9)
Monocytes: 10 %
Neutrophils Absolute: 4 10*3/uL (ref 1.4–7.0)
Neutrophils: 71 %
Platelets: 361 10*3/uL (ref 150–450)
RBC: 4.48 x10E6/uL (ref 3.77–5.28)
RDW: 11.9 % (ref 11.7–15.4)
WBC: 5.5 10*3/uL (ref 3.4–10.8)

## 2020-12-16 LAB — B12 AND FOLATE PANEL
Folate: >20 ng/mL (ref >3.0)
Vitamin B-12: 479 pg/mL (ref 232–1245)

## 2020-12-16 LAB — RPR: RPR Ser Ql: NONREACTIVE

## 2020-12-16 LAB — COMPREHENSIVE METABOLIC PANEL
ALT: 22 IU/L (ref 0–32)
AST: 20 IU/L (ref 0–40)
Albumin/Globulin Ratio: 2.3 — ABNORMAL HIGH (ref 1.2–2.2)
Albumin: 4.8 g/dL (ref 3.8–4.8)
Alkaline Phosphatase: 104 IU/L (ref 44–121)
BUN/Creatinine Ratio: 23 (ref 12–28)
BUN: 19 mg/dL (ref 8–27)
Bilirubin Total: 0.4 mg/dL (ref 0.0–1.2)
CO2: 22 mmol/L (ref 20–29)
Calcium: 10.5 mg/dL — ABNORMAL HIGH (ref 8.7–10.3)
Chloride: 98 mmol/L (ref 96–106)
Creatinine, Ser: 0.83 mg/dL (ref 0.57–1.00)
Globulin, Total: 2.1 g/dL (ref 1.5–4.5)
Glucose: 88 mg/dL (ref 65–99)
Potassium: 5.1 mmol/L (ref 3.5–5.2)
Sodium: 139 mmol/L (ref 134–144)
Total Protein: 6.9 g/dL (ref 6.0–8.5)
eGFR: 76 mL/min/{1.73_m2} (ref >59)

## 2020-12-16 LAB — VITAMIN B1: Thiamine: 110.6 nmol/L (ref 66.5–200.0)

## 2020-12-16 LAB — HOMOCYSTEINE: Homocysteine: 11.9 umol/L (ref 0.0–17.2)

## 2020-12-16 LAB — VITAMIN D 25 HYDROXY (VIT D DEFICIENCY, FRACTURES): Vit D, 25-Hydroxy: 67.9 ng/mL (ref 30.0–100.0)

## 2020-12-16 LAB — TSH: TSH: 2.01 u[IU]/mL (ref 0.450–4.500)

## 2020-12-16 LAB — METHYLMALONIC ACID, SERUM: Methylmalonic Acid: 226 nmol/L (ref 0–378)

## 2021-02-25 ENCOUNTER — Encounter: Payer: Self-pay | Admitting: Psychology

## 2021-02-25 ENCOUNTER — Ambulatory Visit: Payer: Medicare HMO

## 2021-02-25 ENCOUNTER — Ambulatory Visit (INDEPENDENT_AMBULATORY_CARE_PROVIDER_SITE_OTHER): Payer: Medicare HMO | Admitting: Psychology

## 2021-02-25 ENCOUNTER — Other Ambulatory Visit: Payer: Self-pay

## 2021-02-25 DIAGNOSIS — I10 Essential (primary) hypertension: Secondary | ICD-10-CM | POA: Insufficient documentation

## 2021-02-25 DIAGNOSIS — R4189 Other symptoms and signs involving cognitive functions and awareness: Secondary | ICD-10-CM

## 2021-02-25 DIAGNOSIS — F411 Generalized anxiety disorder: Secondary | ICD-10-CM

## 2021-02-25 DIAGNOSIS — G3184 Mild cognitive impairment, so stated: Secondary | ICD-10-CM | POA: Insufficient documentation

## 2021-02-25 DIAGNOSIS — F3341 Major depressive disorder, recurrent, in partial remission: Secondary | ICD-10-CM | POA: Diagnosis not present

## 2021-02-25 HISTORY — DX: Mild cognitive impairment of uncertain or unknown etiology: G31.84

## 2021-02-25 NOTE — Progress Notes (Signed)
   Psychometrician Note   Cognitive testing was administered to Maria Li by Shan Levans, B.S. (psychometrist) under the supervision of Dr. Newman Nickels, Ph.D., licensed psychologist on 02/25/2021. Maria Li did not appear overtly distressed by the testing session per behavioral observation or responses across self-report questionnaires. Rest breaks were offered.    The battery of tests administered was selected by Dr. Newman Nickels, Ph.D. with consideration to Maria Li's current level of functioning, the nature of her symptoms, emotional and behavioral responses during interview, level of literacy, observed level of motivation/effort, and the nature of the referral question. This battery was communicated to the psychometrist. Communication between Dr. Newman Nickels, Ph.D. and the psychometrist was ongoing throughout the evaluation and Dr. Newman Nickels, Ph.D. was immediately accessible at all times. Dr. Newman Nickels, Ph.D. provided supervision to the psychometrist on the date of this service to the extent necessary to assure the quality of all services provided.    Maria Li will return within approximately 1-2 weeks for an interactive feedback session with Dr. Milbert Coulter at which time her test performances, clinical impressions, and treatment recommendations will be reviewed in detail. Maria Li understands she can contact our office should she require our assistance before this time.  A total of 130 minutes of billable time were spent face-to-face with Maria Li by the psychometrist. This includes both test administration and scoring time. Billing for these services is reflected in the clinical report generated by Dr. Newman Nickels, Ph.D.  This note reflects time spent with the psychometrician and does not include test scores or any clinical interpretations made by Dr. Milbert Coulter. The full report will follow in a separate note.

## 2021-02-25 NOTE — Progress Notes (Signed)
NEUROPSYCHOLOGICAL EVALUATION [facility] [place] Department of Neurology  Date of Evaluation: February 25, 2021  Reason for Referral:   SOMER TROTTER is a 70 y.o. right-handed Caucasian female referred by  Naomie Dean, M.D. , to characterize her current cognitive functioning and assist with diagnostic clarity and treatment planning in the context of subjective cognitive decline and family history of Alzheimer's disease.   Assessment and Plan:   Clinical Impression(s): Ms. Maurin pattern of performance is suggestive of exhibited deficits across basic attention, executive functioning, and retrieval aspects of verbal and visual memory. There was also much performance variability across her evaluation, encompassing domains of processing speed, expressive language, visuospatial abilities, and recognition/consolidation aspects of memory. Performance was appropriate across complex attention, safety/judgment, receptive language, and encoding (i.e., learning) aspects of memory. Regarding ADLs, Ms. Glasco lives alone and reported driving without incident. However, her daughter has taken over financial management and she has become reliant on automated medication dispensing (and will still reportedly forget to take these medications at times). Overall, Ms. Kissel is likely on the border between mild cognitive impairment and dementia designations. 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  It should be noted that Ms. Hartsock has a notable family history of Alzheimer's disease and that neuroimaging suggested more prominent medial temporal lobe atrophy. Both of these variables will increase her risk for the development of this disease process. Across memory testing, despite learning  information reasonably well, her retention scores after a delay ranged from 25-47%, which is certainly below expectation and does raise some concerns for the presence of this condition. Variability/weakness across visuospatial abilities and executive functioning, coupled with a slight discrepancy between phonemic and semantic fluency (the latter being worse) would be further consistent with this disease presentation. However and perhaps most importantly, she performed well on some recognition memory trials, which does not suggest a consistent memory storage deficit at the present time. Overall, while the etiology is unclear, Alzheimer's disease should remain on her differential and continued monitoring will be very important in the years to come. If indeed present, it would appear to be in earlier stages at the present time.  Despite some executive and visuospatial dysfunction, Ms. Chisom does not display behavioral features concerning for Lewy body dementia (e.g., fully-formed hallucinations, REM sleep behaviors, or parkinsonian features), making this less likely at the present time. There are no behavioral features to strongly warrant consideration for Parkinson's disease or another more rare condition on this spectrum presently. She also does not display language impairment or personality changes consistent with frontotemporal dementia. 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 While cerebrovascular changes can certainly worsen cognitive functioning, I do not feel that there is enough to warrant consideration for a primary vascular etiology at the present time. She reported minimal to no psychiatric distress or sleep dysfunction on related questionnaires.  Recommendations: A repeat neuropsychological evaluation in 18 months (or sooner if functional decline is noted) is recommended to assess the trajectory of future cognitive decline should it occur. This will also aid in future  efforts towards improved diagnostic clarity.  Ms. Eichel could discuss memory-based medications with Dr. Lucia Gaskins given deficits across memory testing if desired. It is important to highlight that these medications may slow decline in some individuals. Unfortunately, there is no current treatment which  can stop of reverse cognitive decline if Alzheimer's disease is indeed present.  Ms. Orvis BrillRowland expressed a desire to be taken off Ativan and be placed on a different medication to help with anxiety and sleep initiation. I would also point out that Ativan can certainly have cognitive side effects and feel that this would be a reasonable course of action. She should discuss this with her prescribing physician.    Performance across neurocognitive testing is not a strong predictor of an individual's safety operating a motor vehicle. Should her family wish to pursue a formalized driving evaluation, they would be encouraged to contact [employer] in [place], [place] at [phone]. Another option would be through [facility]; however, the latter would likely require a referral from a medical doctor. [facility] can be reached directly at [phone].   Ms. Orvis BrillRowland is encouraged to attend to lifestyle factors for brain health (e.g., regular physical exercise, good nutrition habits, regular participation in cognitively-stimulating activities, and general stress management techniques), which are likely to have benefits for both emotional adjustment and cognition. Optimal control of vascular risk factors (including safe cardiovascular exercise and adherence to dietary recommendations) is encouraged. Continued participation in activities which provide mental stimulation and social interaction is also recommended.   Should there be further progression of deficits over time, Ms. Orvis BrillRowland is unlikely to regain any independent living skills lost. Therefore, it is recommended that she remain as  involved as possible with activities of daily living with supervision to ensure adequate performance. She will likely benefit from the establishment and maintenance of a routine in order to maximize her functional abilities over time.  It will be important for Ms. Orvis BrillRowland to have another person with her when in situations where she may need to process information, weigh the pros and cons of different options, and make decisions, in order to ensure that she fully understands and recalls all information to be considered.  Important information to remember should be provided in written format whenever possible.   To address problems with fluctuating attention, she may wish to consider:   -Avoiding external distractions when needing to concentrate   -Limiting exposure to fast paced environments with multiple sensory demands   -Writing down complicated information and using checklists   -Attempting and completing one task at a time (i.e., no multi-tasking)   -Verbalizing aloud each step of a task to maintain focus   -Taking frequent breaks during the completion of steps/tasks to avoid fatigue   -Reducing the amount of information considered at one time  Review of Records:   Ms. Orvis [facility]illRowland [facility] Naomie Dean(Antonia Ahern, M.D.) on 12/04/2020 for an evaluation of memory loss. Ms. Orvis BrillRowland reported ongoing memory loss. She noted her belief that this was related to the COVID-19 pandemic in that she did not go out during this time. Her daughter who was present reported more significant memory changes, noting that Ms. Orvis BrillRowland will often repeat questions throughout phone conversations. Ms. Orvis BrillRowland currently lives alone. She drives and denied getting lost. Her daughter had to take over managing finances due to mistakes her mother was making. This included Ms. Orvis BrillRowland continuing to write checks for a bank account which she forget she had previously closed. Medications had to be changed to an  automatic dispenser and there is still reported trouble with Ms. Zhao remembering to take her medications as prescribed. Her daughter added that her mother has a very hard time remembering passwords and frequently gets locked  out of accounts. There is a history of ongoing anxiety and depression and Ms. Yarrow is reportedly seen by a psychiatrist. There is also a noteworthy family history of Alzheimer's disease. Ultimately, Ms. Malay was referred for a comprehensive neuropsychological evaluation to characterize her cognitive abilities and to assist with diagnostic clarity and treatment planning.   Brain MRI on 12/15/2020 revealed mild, largely generalized cortical atrophy. However, there was report of more pronounced atrophy in the medial temporal lobes. Imaging also revealed T2/FLAIR hyperintensities in the hemispheric white matter, pons, and left basal ganglia, most consistent with mild chronic microvascular ischemic changes.   Past Medical History:  Diagnosis Date   Allergic rhinitis 06/20/2014   ASCUS with positive high risk HPV cervical 07/06/2016   Calcaneus fracture, right 06/20/2014   Dizziness of unknown cause 06/20/2014   Eustachian tube dysfunction 06/20/2014   Generalized anxiety disorder    Hyperkalemia 03/26/2016   Hyperlipidemia    Hypertension    Insomnia 03/28/2014   Left low back pain 08/07/2015   Major depressive disorder 03/28/2014   Malaise and fatigue 03/15/2016   Near syncope 03/17/2018   Nocturia 06/20/2014   Pain in thoracic spine 06/20/2014   Primary osteoarthritis of both knees 03/28/2014   osteoarthritis with anterior horn of the medial meniscus tear   Pure hypercholesterolemia 06/20/2014    Past Surgical History:  Procedure Laterality Date   BLADDER SUSPENSION     tummy tuck      Current Outpatient Medications:    CALCIUM PO, Take 10 mg by mouth., Disp: , Rfl:    DULoxetine (CYMBALTA) 60 MG capsule, TAKE 1 CAPSULE BY MOUTH EVERY DAY (Patient taking  differently: 2 (two) times daily.), Disp: 90 capsule, Rfl: 3   hydrOXYzine (ATARAX/VISTARIL) 25 MG tablet, TAKE 1 TABLET (25 MG TOTAL) BY MOUTH AS NEEDED FOR ITCHING., Disp: 90 tablet, Rfl: 0   lamoTRIgine (LAMICTAL) 25 MG tablet, Take 50 mg by mouth daily., Disp: , Rfl:    LORazepam (ATIVAN) 0.5 MG tablet, Take 0.5 mg by mouth daily as needed., Disp: , Rfl:    rosuvastatin (CRESTOR) 10 MG tablet, Take 10 mg by mouth daily., Disp: , Rfl:   Clinical Interview:   The following information was obtained during a clinical interview with Ms. Pinette and her daughter prior to cognitive testing.  Cognitive Symptoms: Decreased short-term memory: Endorsed. Ms. Fonseca described largely generalized deficits surrounding short-term memory. When asked specific questions, she highlighted some word finding concerns but emphasized that information will generally come to her with time. Her daughter expressed far more significant concerns surrounding memory with Ms. Murcia having difficulty recalling details of past conversations, often repeats herself, and misplaces/loses things around her residence. Decreased long-term memory: Denied. Decreased attention/concentration: Denied. Reduced processing speed: Endorsed "a little bit." Difficulties with executive functions: Denied. Her daughter did note some personality changes in that her mother seems to be far more easily irritated and has some apparently abrupt mood shifts. Ms. Alles was defensive about this. However, her daughter stated that this was a newer change and did seem to happen somewhat frequently.  Difficulties with emotion regulation: Denied outside what is described above.  Difficulties with receptive language: Denied. Difficulties with word finding: Endorsed. Decreased visuoperceptual ability: Denied.  Trajectory of deficits: At first Ms. Wisdom stated that cognitive decline had been present for the past six months. However, she later stated  observing changes towards the beginning of the pandemic, which would have been around two years prior. Her daughter expressed a timeline  of decline surrounding the past 1-2 years.   Difficulties completing ADLs: Endorsed. As stated above, her daughter has taken over managing finances due to mistakes her mother was making. This included Ms. Hobart continuing to write checks for a bank account which she forget she had previously closed. Medications had to be changed to an automatic dispenser and there is still reported trouble with Ms. Tiner remembering to take her medications as prescribed. Her daughter expressed some concerns that her mother is able to get into this machine and she was unsure how much of certain medications (namely Ativan) she is taking on a daily basis. Her daughter also added that her mother has a very hard time remembering passwords and frequently gets locked out of accounts. Ms. Evangelist was defensive regarding this and often attempted to contradict these reports by highlighting that she recently cooked and did not leave the stove on, as well as is able to play brain games on her phone successfully and work outside in the yard. She continues to drive without reported issue. Her daughter did note that she has seemingly become more reliant on her GPS lately.   Additional Medical History: History of traumatic brain injury/concussion: Denied. History of stroke: Denied. History of seizure activity: Denied. History of known exposure to toxins: Denied. Symptoms of chronic pain: Denied. Experience of frequent headaches/migraines: Denied. Frequent instances of dizziness/vertigo: Endorsed. More specifically, she described experiencing "dizzy spells." Symptoms were described to occur when she stands up quickly from a previously seated or supine position.   Sensory changes: She wears glasses with positive effect. She also utilizes hearing aids but noted that they do not always work as  intended. Other sensory changes/difficulties (i.e., taste or smell) were denied.  Balance/coordination difficulties: Denied. She also denied a history of recent falls.  Other motor difficulties: She reported tremulous behaviors in her hands. However, when she held her hands to demonstrate this, no tremulous behaviors were observed. She was unclear how frequent reported symptoms occur or if they were more related to ongoing anxiety/stress.   Sleep History: Estimated hours obtained each night: 7-8 hours.  Difficulties falling asleep: She reported taking 0.5mg  of Ativan nightly in order to help her fall asleep. This was said to be effective. However, she stated that she was interested in getting off this medication and wanted to try something else.  Difficulties staying asleep: Denied. Feels rested and refreshed upon awakening: Endorsed.  History of snoring: Unknown as she lives alone. History of waking up gasping for air: Denied. Witnessed breath cessation while asleep: Denied.  History of vivid dreaming: Denied. Excessive movement while asleep: Denied. Instances of acting out her dreams: Denied.  Psychiatric/Behavioral Health History: Depression: Ms. Monts daughter noted that her mother has had a longstanding history of generally mild depressive symptoms which started approximately 20 years ago following the passing of her husband. She has utilized medications since that time and been on a largely stable dose with positive effect. However, lately, given stress from the ongoing pandemic, her daughter stated that they had been adjusting that medication to provide increased benefit. Ms. Meldrum described her current mood as "good." Current or remote suicidal ideation, intent, or plan was denied.  Anxiety: Ms. Eshleman acknowledged longstanding and generalized symptoms of anxiety. She reported taking Ativan as needed with benefit. However, her daughter did express some concerns that this medication  was being taken more frequently than previously. Mania: Denied. Trauma History: Denied. Visual/auditory hallucinations: Denied. Delusional thoughts: Denied.  Tobacco: Denied. Alcohol: She  reported consuming a glass of wine very rarely and denied a history of problematic alcohol abuse or dependence.  Recreational drugs: Denied. Caffeine: She reported consuming two cups of coffee in the morning.   Family History: Problem Relation Age of Onset   Heart attack Father    Hyperlipidemia Father    Alzheimer's disease Father    Cancer Sister        breast   Cancer Maternal Aunt        breast   Heart attack Paternal Uncle    Alzheimer's disease Mother    Alzheimer's disease Maternal Grandmother    Alzheimer's disease Maternal Aunt    This information was confirmed by Ms. Bastyr.  Academic/Vocational History: Highest level of educational attainment: 12 years. She completed high school. She also reported completing an 49-month beauty course. She described herself as an average student in academic settings. Reading was noted as a potential relative weakness.  History of developmental delay: Denied. History of grade repetition: Denied. Enrollment in special education courses: Denied. History of LD/ADHD: Denied.  Employment: Retired. She reported a fairly minimal work history. 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   Evaluation Results:   Behavioral Observations: Ms. Rinn was accompanied by her daughter, arrived to her appointment on time, and was appropriately dressed and groomed. She appeared alert and oriented. Observed gait and station were within normal limits. Gross motor functioning appeared intact upon informal observation and no abnormal movements (e.g., tremors) were noted. Her affect was generally relaxed and positive, but did range  appropriately given the subject being discussed during the clinical interview or the task at hand during testing procedures. She was notably defensive throughout portions of the interview, namely whenever her daughter would attempt to provide information surrounding potential deficits or changes. Her daughter was interviewed separately as a result. Spontaneous speech was fluent. Mild word finding difficulties were observed during interview. Thought processes were coherent and normal in content. She did repeat herself several times during interview seemingly without awareness that she had already provided that information previously. Insight into her cognitive difficulties appeared limited in that she contradicted much of what her daughter expressed and attributed changes to the ongoing pandemic.   During testing, Ms. [place] was noted to be very impulsive and often attempted to start tasks without letting the psychometrist finish providing instructions. Sustained attention was appropriate. Task engagement was adequate and she persisted when challenged. Mild hearing-related difficulties were noted; however, they were noted believed to significantly affect test performance. Overall, Ms. Pellicano was cooperative with the clinical interview and subsequent testing procedures.   Adequacy of Effort: The validity of neuropsychological testing is limited by the extent to which the individual being tested may be assumed to have exerted adequate effort during testing. Ms. Wareing expressed her intention to perform to the best of her abilities and exhibited adequate task engagement and persistence. Scores across stand-alone and embedded performance validity measures were variable but largely within expectation. As such, the results of the current evaluation are believed to be a valid representation of Ms. Sarinana's current cognitive functioning.  Test Results: Ms. Delisi was fully oriented at the time of the current  evaluation.  Intellectual abilities based upon educational and vocational attainment were estimated to be in the average range. Premorbid abilities were estimated to be within the below average range based upon a single-word  reading test.   Processing speed was variable, ranging from the well below average to average normative ranges. Basic attention was well below average. More complex attention (e.g., working memory) was below average. Executive functioning was largely well below average to below average. She did perform in the well above average range on a task assessing safety and judgment.  Assessed receptive language abilities were average. Likewise, Ms. Molter did not exhibit notable difficulties comprehending task instructions and answered all questions asked of her appropriately. Assessed expressive language was variable. Phonemic fluency was below average, semantic fluency was exceptionally low to below average, and confrontation naming was average to above average.      Assessed visuospatial/visuoconstructional abilities were exceptionally low to well below average outside of her copy of a complex figure which was appropriate. Points were lost on her drawing of a clock due to her placing the number 9 twice, as well as incorrect hand placement with no size differentiation. She originally omitted the number 11 but did eventually add it in its correct location after stating she was finished.    Learning (i.e., encoding) of novel verbal information was below average to average. Spontaneous delayed recall (i.e., retrieval) of previously learned information was well below average to below average. Retention rates were 38% (raw score of 3) across a story learning task, 25% (raw score of 2) across a list learning task, and 47% across a figure drawing task. Performance across recognition tasks was well below average on a visual task but average across verbal tasks, suggesting some evidence for  information consolidation.   Results of emotional screening instruments suggested that recent symptoms of generalized anxiety were in the minimal range, while symptoms of depression were within normal limits. A screening instrument assessing recent sleep quality suggested the presence of minimal sleep dysfunction.  Tables of Scores:   Note: This summary of test scores accompanies the interpretive report and should not be considered in isolation without reference to the appropriate sections in the text. Descriptors are based on appropriate normative data and may be adjusted based on clinical judgment. The terms "impaired" and "within normal limits (WNL)" are used when a more specific level of functioning cannot be determined.       Validity Testing:   DESCRIPTOR       Dot Counting Test: --- --- Within Expectation  RBANS Effort Index: --- --- Within Expectation  WAIS-IV Reliable Digit Span: --- --- Below Expectation  D-KEFS Color Word Effort Index: --- --- Within Expectation       Orientation:      Raw Score Percentile   NAB Orientation, Form 1 29/29 --- ---       Cognitive Screening:           Raw Score Percentile   SLUMS: 16/30 --- ---       RBANS, Form A: Standard Score/ Scaled Score Percentile   Total Score 78 7 Well Below Average  Immediate Memory 85 16 Below Average    List Learning 7 16 Below Average    Story Memory 8 25 Average  Visuospatial/Constructional 78 7 Well Below Average    Figure Copy 9 37 Average    Line Orientation 8/20 <2 Exceptionally Low  Language 90 25 Average    Picture Naming 10/10 51-75 Average    Semantic Fluency 6 9 Below Average  Attention 72 3 Well Below Average    Digit Span 4 2 Well Below Average    Coding 7 16 Below Average  Delayed Memory  87 19 Below Average    List Recall 2/10 10-16 Below Average    List Recognition 20/20 51-75 Average    Story Recall 4 2 Well Below Average    Story Recognition 12/12 69+ Average    Figure Recall 7 16 Below  Average    Figure Recognition 2/8 2-3 Well Below Average       Intellectual Functioning:           Standard Score Percentile   Test of Premorbid Functioning: 84 14 Below Average       Attention/Executive Function:          Trail Making Test (TMT): Raw Score (T Score) Percentile     Part A 34 secs.,  1 error (50) 50 Average    Part B 221 secs.,  2 errors (32) 4 Well Below Average         Scaled Score Percentile   WAIS-IV Digit Span: 5 5 Well Below Average    Forward 5 5 Well Below Average    Backward 6 9 Below Average    Sequencing 7 16 Below Average        Scaled Score Percentile   WAIS-IV Similarities: 4 2 Well Below Average       D-KEFS Color-Word Interference Test: Raw Score (Scaled Score) Percentile     Color Naming 47 secs. (5) 5 Well Below Average    Word Reading 28 secs. (9) 37 Average    Inhibition 93 secs. (7) 16 Below Average      Total Errors 3 errors (10) 50 Average    Inhibition/Switching 101 secs. (7) 16 Below Average      Total Errors 3 errors (10) 50 Average       D-KEFS Verbal Fluency Test: Raw Score (Scaled Score) Percentile     Letter Total Correct 26 (7) 16 Below Average    Category Total Correct 19 (4) 2 Well Below Average    Category Switching Total Correct 9 (6) 9 Below Average    Category Switching Accuracy 6 (5) 5 Well Below Average      Total Set Loss Errors 0 (13) 84 Above Average      Total Repetition Errors 2 (11) 63 Average       NAB Executive Functions Module, Form 1: T Score Percentile     Judgment 65 93 Well Above Average       Language:          Verbal Fluency Test: Raw Score (T Score) Percentile     Phonemic Fluency (FAS) 26 (40) 16 Below Average    Animal Fluency 9 (28) 2 Exceptionally Low        NAB Language Module, Form 1: T Score Percentile     Auditory Comprehension 56 73 Average    Naming 30/31 (58) 79 Above Average       Visuospatial/Visuoconstruction:      Raw Score Percentile   Clock Drawing: 6/10 --- Impaired         Scaled Score Percentile   WAIS-IV Block Design: 4 2 Well Below Average       Mood and Personality:      Raw Score Percentile   Geriatric Depression Scale: 8 --- Within Normal Limits  Geriatric Anxiety Scale: 7 --- Minimal    Somatic 2 --- Minimal    Cognitive 1 --- Minimal    Affective 4 --- Mild       Additional Questionnaires:      Raw Score Percentile  PROMIS Sleep Disturbance Questionnaire: 14 --- None to Slight   Informed Consent and Coding/Compliance:   The current evaluation represents a clinical evaluation for the purposes previously outlined by the referral source and is in no way reflective of a forensic evaluation.   Ms. Byrum was provided with a verbal description of the nature and purpose of the present neuropsychological evaluation. Also reviewed were the foreseeable risks and/or discomforts and benefits of the procedure, limits of confidentiality, and mandatory reporting requirements of this provider. The patient was given the opportunity to ask questions and receive answers about the evaluation. Oral consent to participate was provided by the patient.   This evaluation was conducted by Newman Nickels, Ph.D., licensed clinical neuropsychologist. Ms. Margolis completed a clinical interview with Dr. Milbert Coulter, billed as one unit [phone], and 130 minutes of cognitive testing and scoring, billed as one unit [phone] and three additional units [id number]. Psychometrist Shan Levans, B.S., assisted Dr. Milbert Coulter with test administration and scoring procedures. As a separate and discrete service, Dr. Milbert Coulter spent a total of 155 minutes in interpretation and report writing billed as one unit [phone] and two units [id number].

## 2021-03-06 ENCOUNTER — Encounter: Payer: Medicare HMO | Admitting: Psychology

## 2021-03-12 ENCOUNTER — Ambulatory Visit (INDEPENDENT_AMBULATORY_CARE_PROVIDER_SITE_OTHER): Payer: Medicare HMO | Admitting: Psychology

## 2021-03-12 ENCOUNTER — Other Ambulatory Visit: Payer: Self-pay

## 2021-03-12 DIAGNOSIS — G3184 Mild cognitive impairment, so stated: Secondary | ICD-10-CM

## 2021-03-12 NOTE — Progress Notes (Signed)
   Neuropsychology Feedback Session Maria Li. [facility] Department of Neurology  Reason for Referral:   Maria Li is a 70 y.o. right-handed Caucasian female referred by  Maria Li, M.D. , to characterize her current cognitive functioning and assist with diagnostic clarity and treatment planning in the context of subjective cognitive decline and family history of Alzheimer's disease.  Feedback:   Maria Li completed a comprehensive neuropsychological evaluation on 02/25/2021. Please refer to that encounter for the full report and recommendations. Briefly, results suggested deficits across basic attention, executive functioning, and retrieval aspects of verbal and visual memory. There was also much performance variability across her evaluation, encompassing domains of processing speed, expressive language, visuospatial abilities, and recognition/consolidation aspects of memory. 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 Both of these variables will increase her risk for the development of this disease process. Across memory testing, despite learning information reasonably well, her retention scores after a delay ranged from 25-47%, which is certainly below expectation and does raise some concerns for the presence of this condition. Variability/weakness across visuospatial abilities and executive functioning, coupled with a slight discrepancy between phonemic and semantic fluency (the latter being worse) would be further consistent with this disease presentation. However and perhaps most importantly, she performed well on some recognition memory trials, which does not suggest a consistent memory storage deficit at the present time. Overall, while the etiology is unclear, Alzheimer's disease should remain on her differential and continued monitoring will  be very important in the years to come.  Maria Li was accompanied by her daughter during the current telephone call. They were within their respective residences while I was within my office. I discussed the limitations of evaluation and management by telemedicine and the availability of in person appointments. Maria Li expressed her understanding and agreed to proceed. Content of the current session focused on the results of her neuropsychological evaluation. Maria Li and her daughter were given the opportunity to ask questions and their questions were answered. They were encouraged to reach out should additional questions arise. A copy of her report was mailed at the conclusion of the visit.      25 minutes were spent conducting the current feedback session with Maria Li, billed as one unit [phone].

## 2021-03-16 ENCOUNTER — Telehealth: Payer: Self-pay | Admitting: Neurology

## 2021-03-16 NOTE — Telephone Encounter (Signed)
Patient completed her formal memory testing. She can follow up with [place] in the fall maybe September or October with Butch Penny. Please schedule thanks

## 2021-03-22 ENCOUNTER — Other Ambulatory Visit: Payer: Self-pay | Admitting: Sports Medicine

## 2021-03-22 DIAGNOSIS — F32A Depression, unspecified: Secondary | ICD-10-CM

## 2021-06-16 ENCOUNTER — Ambulatory Visit: Payer: Medicare HMO | Admitting: Adult Health

## 2021-08-13 ENCOUNTER — Other Ambulatory Visit: Payer: Self-pay

## 2021-08-13 ENCOUNTER — Encounter: Payer: Self-pay | Admitting: Adult Health

## 2021-08-13 ENCOUNTER — Ambulatory Visit: Payer: Medicare HMO | Admitting: Adult Health

## 2021-08-13 VITALS — BP 124/72 | HR 79 | Wt 140.9 lb

## 2021-08-13 DIAGNOSIS — G3184 Mild cognitive impairment, so stated: Secondary | ICD-10-CM | POA: Diagnosis not present

## 2021-08-13 MED ORDER — DONEPEZIL HCL 5 MG PO TABS: 5.0000 mg | ORAL_TABLET | Freq: Every day | ORAL | 11 refills | Status: DC

## 2021-08-13 NOTE — Progress Notes (Signed)
PATIENT: MILLIANA Li DOB: May 21, 1951  REASON FOR VISIT: follow up HISTORY FROM: patient PRIMARY NEUROLOGIST: Dr. Lucia Gaskins   HISTORY OF PRESENT ILLNESS: Today 08/13/21:   Maria Li is a 70 year old female with a history of memory disturbance.  She returns today for follow-up.  The patient is here with her daughter.  Her daughter currently resides in [place].  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  The patient currently lives at home alone.  She is able to complete all ADLs independently.  She does operate a motor vehicle however the daughter has some concerns about her getting lost.  She states that she had to tell her how to get into her [place].  The patient currently manages her own medications however the daughter has some concerns about that as well.  On a tight note the daughter notes that the patient sometimes get agitated towards loved ones and closest friends.  She is currently not on any memory medication.  She returns today for an evaluation.  HISTORY (copied from Dr. Trevor Mace note)  Maria Li is a 70 y.o. female here as requested by Maria Li,* for Memory loss.  Past medical history hypertension, hyperlipidemia, uncontrolled anxiety and depression.  She has a family history of Alzheimer's in her mother and maternal aunt and maternal grandmother.   I reviewed primary care provider's notes: Patient has anxiety and depression, she is persistently depressed, she has had some suicidal thoughts but no ideation or plan and no prior attempts, she was on max dose of Lexapro but switched to Prozac and back to Lexapro started Cymbalta with Wellbutrin.  She is seeing behavioral therapy.  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  I reviewed the chart showed that it does not appear  she completed MRI of the brain.  Multiple labs were ordered including RPR, B12, sed rate, CBC, TSH, folate, copper, IFE and CMP and those also have not been completed work-up was ordered at the end of October 2021.  Also chart shows she canceled an earlier new patient appointment in our practice.  It appears she had a recent consult with internal medicine at [facility], also diagnosed with anxiety and depression, I do not see any behavioral health records.   Patient is here with her daughter who also reports much information.  She reports memory loss.  Patient made some financial decisions like closing her bank account she did not remember closing in the spring of last year.  She still drives and denies getting lost.  Daughter states there is still significant memory changes.  Patient lives alone.  Patient feels the pandemic might estimates to do with it because she was by herself or so long. Daughter says they have been working with a psychiatrist for her anxiety and depression. She thinks it started during the pandemic because she didn't get out, she still cooks and she remembers all the recipes, she can follow recipes as well without difficulty, still very social, Daughter says she went out during the pandemic a lot. She has lost 20 pounds over the last 6-9 months, she didn;t eat, she sits in bed and watches TV. She used to exercise more and she keeps saying she needs to join the [employer] again. She manages her own finances, daughter says she was having trouble such as she wrote a check  on a closed account she forgot, she thought she sent her estimated taxes and didn;t, what she used to do once a once she was struggling with. mother is having a hard time with passwords and then she gets locked out and forgets the password. Medications had to change to an automatic dispenser and she forgets taking her meds. She is repeating questions in the same phone conversations.    Reviewed notes, labs and imaging from outside  physicians, which showed: see above  REVIEW OF SYSTEMS: Out of a complete 14 system review of symptoms, the patient complains only of the following symptoms, and all other reviewed systems are negative.  ALLERGIES: No Known Allergies  HOME MEDICATIONS: Outpatient Medications Prior to Visit  Medication Sig Dispense Refill   ARIPiprazole (ABILIFY) 5 MG tablet Take 5 mg by mouth at bedtime.     CALCIUM PO Take 10 mg by mouth.     DULoxetine (CYMBALTA) 60 MG capsule TAKE 1 CAPSULE BY MOUTH EVERY DAY (Patient taking differently: 60 mg 2 (two) times daily.) 90 capsule 3   hydrOXYzine (ATARAX/VISTARIL) 25 MG tablet TAKE 1 TABLET (25 MG TOTAL) BY MOUTH AS NEEDED FOR ITCHING. 90 tablet 0   lamoTRIgine (LAMICTAL) 25 MG tablet Take 50 mg by mouth daily.     rosuvastatin (CRESTOR) 10 MG tablet Take 10 mg by mouth daily.     LORazepam (ATIVAN) 0.5 MG tablet Take 0.5 mg by mouth daily as needed.     No facility-administered medications prior to visit.    PAST MEDICAL HISTORY: Past Medical History:  Diagnosis Date   Allergic rhinitis 06/20/2014   ASCUS with positive high risk HPV cervical 07/06/2016   Calcaneus fracture, right 06/20/2014   Dizziness of unknown cause 06/20/2014   Eustachian tube dysfunction 06/20/2014   Generalized anxiety disorder    Hyperkalemia 03/26/2016   Hyperlipidemia    Hypertension    Insomnia 03/28/2014   Left low back pain 08/07/2015   Major depressive disorder 03/28/2014   Malaise and fatigue 03/15/2016   MCI (mild cognitive impairment) with memory loss 02/25/2021   Near syncope 03/17/2018   Nocturia 06/20/2014   Pain in thoracic spine 06/20/2014   Primary osteoarthritis of both knees 03/28/2014   osteoarthritis with anterior horn of the medial meniscus tear   Pure hypercholesterolemia 06/20/2014    PAST SURGICAL HISTORY: Past Surgical History:  Procedure Laterality Date   BLADDER SUSPENSION     tummy tuck      FAMILY HISTORY: Family History  Problem  Relation Age of Onset   Heart attack Father    Hyperlipidemia Father    Alzheimer's disease Father    Cancer Sister        breast   Cancer Maternal Aunt        breast   Heart attack Paternal Uncle    Alzheimer's disease Mother    Alzheimer's disease Maternal Grandmother    Alzheimer's disease Maternal Aunt     SOCIAL HISTORY: Social History   Socioeconomic History   Marital status: Widowed    Spouse name: Not on file   Number of children: 1   Years of education: 12   Highest education level: High school graduate  Occupational History   Not on file  Tobacco Use   Smoking status: Former   Smokeless tobacco: Never  Substance and Sexual Activity   Alcohol use: Yes    Comment: seldom, glass of wine    Drug use: No   Sexual activity: Not on  file  Other Topics Concern   Not on file  Social History Narrative   Lives alone    Right handed   Caffeine: decaf    Social Determinants of Health   Financial Resource Strain: Not on file  Food Insecurity: Not on file  Transportation Needs: Not on file  Physical Activity: Not on file  Stress: Not on file  Social Connections: Not on file  Intimate Partner Violence: Not on file      PHYSICAL EXAM  Vitals:   08/13/21 1353  BP: 124/72  Pulse: 79  Weight: 140 lb 14.4 oz (63.9 kg)   Body mass index is 23.09 kg/m.  MMSE - Mini Mental State Exam 08/13/2021 12/04/2020  Orientation to time 4 5  Orientation to Place 5 4  Registration 3 3  Attention/ Calculation 2 3  Recall 2 3  Language- name 2 objects 2 2  Language- repeat 1 1  Language- follow 3 step command 3 3  Language- read & follow direction 1 1  Write a sentence 1 1  Copy design 1 1  Total score 25 27    Generalized: Well developed, in no acute distress   Neurological examination  Mentation: Alert oriented to time, place, history taking. Follows all commands speech and language fluent Cranial nerve II-XII: Pupils were equal round reactive to light.  Extraocular movements were full, visual field were full on confrontational test. Facial sensation and strength were normal. Uvula tongue midline. Head turning and shoulder shrug  were normal and symmetric. Motor: The motor testing reveals 5 over 5 strength of all 4 extremities. Good symmetric motor tone is noted throughout.  Sensory: Sensory testing is intact to soft touch on all 4 extremities. No evidence of extinction is noted.  Coordination: Cerebellar testing reveals good finger-nose-finger and heel-to-shin bilaterally.  Gait and station: Gait is normal.  Reflexes: Deep tendon reflexes are symmetric and normal bilaterally.   DIAGNOSTIC DATA (LABS, IMAGING, TESTING) - I reviewed patient records, labs, notes, testing and imaging myself where available.  Lab Results  Component Value Date   WBC 5.5 12/04/2020   HGB 13.9 12/04/2020   HCT 42.2 12/04/2020   MCV 94 12/04/2020   PLT 361 12/04/2020      Component Value Date/Time   NA 139 12/04/2020 1223   K 5.1 12/04/2020 1223   CL 98 12/04/2020 1223   CO2 22 12/04/2020 1223   GLUCOSE 88 12/04/2020 1223   GLUCOSE 111 05/07/2020 1219   BUN 19 12/04/2020 1223   CREATININE 0.83 12/04/2020 1223   CREATININE 0.79 05/07/2020 1219   CALCIUM 10.5 (H) 12/04/2020 1223   PROT 6.9 12/04/2020 1223   ALBUMIN 4.8 12/04/2020 1223   AST 20 12/04/2020 1223   ALT 22 12/04/2020 1223   ALKPHOS 104 12/04/2020 1223   BILITOT 0.4 12/04/2020 1223   GFRNONAA 76 05/07/2020 1219   GFRAA 88 05/07/2020 1219   Lab Results  Component Value Date   CHOL 143 05/07/2020   HDL 75 05/07/2020   LDLCALC 55 05/07/2020   TRIG 54 05/07/2020   CHOLHDL 1.9 05/07/2020   Lab Results  Component Value Date   HGBA1C 5.6 05/12/2017   Lab Results  Component Value Date   VITAMINB12 479 12/04/2020   Lab Results  Component Value Date   TSH 2.010 12/04/2020      ASSESSMENT AND PLAN 70 y.o. year old female  has a past medical history of Allergic rhinitis  (06/20/2014), ASCUS with positive high risk HPV cervical (  07/06/2016), Calcaneus fracture, right (06/20/2014), Dizziness of unknown cause (06/20/2014), Eustachian tube dysfunction (06/20/2014), Generalized anxiety disorder, Hyperkalemia (03/26/2016), Hyperlipidemia, Hypertension, Insomnia (03/28/2014), Left low back pain (08/07/2015), Major depressive disorder (03/28/2014), Malaise and fatigue (03/15/2016), MCI (mild cognitive impairment) with memory loss (02/25/2021), Near syncope (03/17/2018), Nocturia (06/20/2014), Pain in thoracic spine (06/20/2014), Primary osteoarthritis of both knees (03/28/2014), and Pure hypercholesterolemia (06/20/2014). here with:  1.  Memory disturbance-mild neurocognitive impairment  -Discussed recent neuropsychological testing results with the patient and her daughter. -We will start Aricept 5 mg at bedtime if tolerating can increase to 10 mg -Discussed future planning-concerned that the patient is living alone and her closest relative which is her daughter is in [place]. -Discussed operating a motor vehicle.  Advised that Dr. Carmelina Noun had included in his note information about the [facility] driving school. - MMSE 25/30 previously 27/30 -Follow-up in 6 months or sooner if needed   I spent 45 minutes of face-to-face and non-face-to-face time with patient.  Discuss neuropsychological evaluation, prognosis, medications.   Butch Penny, MSN, NP-C 08/13/2021, 2:02 PM [facility] [address] [place], [place] [address] [phone]

## 2021-08-13 NOTE — Patient Instructions (Signed)
Your Plan:  Start Aricept 5 mg at bedtime if tolerating after 1 month can increase to 10 mg at bedtime   Thank you for coming to see [place] at [facility]. I hope we have been able to provide you high quality care today.  You may receive a patient satisfaction survey over the next few weeks. We would appreciate your feedback and comments so that we may continue to improve ourselves and the health of our patients.

## 2022-02-24 ENCOUNTER — Encounter: Payer: Self-pay | Admitting: Adult Health

## 2022-02-24 ENCOUNTER — Ambulatory Visit: Payer: Medicare HMO | Admitting: Adult Health

## 2022-02-24 VITALS — BP 120/72 | HR 73 | Ht 65.5 in | Wt 141.8 lb

## 2022-02-24 DIAGNOSIS — R4189 Other symptoms and signs involving cognitive functions and awareness: Secondary | ICD-10-CM

## 2022-02-24 NOTE — Patient Instructions (Addendum)
[facility] [employer] in [place], [place] at [phone].  Restart Aricept 5 mg at bedtime If your symptoms worsen or you develop new symptoms please let us know.

## 2022-02-24 NOTE — Progress Notes (Signed)
PATIENT: LOVENE MARET DOB: [birth year]  REASON FOR VISIT: follow up HISTORY FROM: patient PRIMARY NEUROLOGIST: Dr. Jaynee Eagles   Chief Complaint  Patient presents with   MCI    Rm 19, 6 month FU, dgtr- Danielle  MMSE 25     HISTORY OF PRESENT ILLNESS: Today 02/24/22: Ms. Fraiser is a 71 year old female with a history of memory disturbance.  She returns today for follow-up. Living at home by herself. Able to complete all ADLs.  Patient is here with her daughter.  Her daughter gave me a typed note to let me know that the patient stopped taking her medications abruptly but then started taking them for few days but then stopped again.  She has misplaced her credit card numerous times.  Has to rely on GPS 80% of the time while driving.  She has gotten lost going to familiar places.  She recently went to her granddaughter's graduation but did not recall attending a few hours later even the next day.  Daughter reports that for the past week she has been very sleepy often falling asleep while sitting up.  She is very repetitive.  Behavior wise daughter states that she met a gentleman on Facebook and invited him to come out of town to stay at her house which he did without ever meeting him.  Patient also misplaced some tax documents which resulted in a late estimate tax payment daughter is trying to handle all the finances.  Daughter has talked to the patient about exploring options for future care with the patient defers.   Ms. Wonnacott is a 71 year old female with a history of memory disturbance.  She returns today for follow-up.  The patient is here with her daughter.  Her daughter currently resides in [place].  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  The patient currently lives at home alone.  She is able to complete all ADLs independently.  She does operate a motor vehicle however the daughter has some concerns about her getting lost.  She  states that she had to tell her how to get into her neighborhood [place] the [place].  The patient currently manages her own medications however the daughter has some concerns about that as well.  On a tight note the daughter notes that the patient sometimes get agitated towards loved ones and closest friends.  She is currently not on any memory medication.  She returns today for an evaluation.  HISTORY (copied from Dr. Cathren Laine note)  MAKYIA ERXLEBEN is a 71 y.o. female here as requested by Silverio Decamp,* for Memory loss.  Past medical history hypertension, hyperlipidemia, uncontrolled anxiety and depression.  She has a family history of Alzheimer's in her mother and maternal aunt and maternal grandmother.   I reviewed primary care provider's notes: Patient has anxiety and depression, she is persistently depressed, she has had some suicidal thoughts but no ideation or plan and no prior attempts, she was on max dose of Lexapro but switched to Prozac and back to Lexapro started Cymbalta with Wellbutrin.  She is seeing behavioral therapy.  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  I reviewed the chart showed that it does not appear she completed MRI of the brain.  Multiple labs were ordered including RPR, B12, sed rate, CBC, TSH, folate, copper, IFE and CMP and those also have not been completed  work-up was ordered at the end of October 2021.  Also chart shows she canceled an earlier new patient appointment in our practice.  It appears she had a recent consult with internal medicine at [facility], also diagnosed with anxiety and depression, I do not see any behavioral health records.   Patient is here with her daughter who also reports much information.  She reports memory loss.  Patient made some financial decisions like closing her bank account she did not remember closing in the spring of  last year.  She still drives and denies getting lost.  Daughter states there is still significant memory changes.  Patient lives alone.  Patient feels the pandemic might estimates to do with it because she was by herself or so long. Daughter says they have been working with a psychiatrist for her anxiety and depression. She thinks it started during the pandemic because she didn't get out, she still cooks and she remembers all the recipes, she can follow recipes as well without difficulty, still very social, Daughter says she went out during the pandemic a lot. She has lost 20 pounds over the last 6-9 months, she didn;t eat, she sits in bed and watches TV. She used to exercise more and she keeps saying she needs to join the [employer] again. She manages her own finances, daughter says she was having trouble such as she wrote a check on a closed account she forgot, she thought she sent her estimated taxes and didn;t, what she used to do once a once she was struggling with. mother is having a hard time with passwords and then she gets locked out and forgets the password. Medications had to change to an automatic dispenser and she forgets taking her meds. She is repeating questions in the same phone conversations.    Reviewed notes, labs and imaging from outside physicians, which showed: see above  REVIEW OF SYSTEMS: Out of a complete 14 system review of symptoms, the patient complains only of the following symptoms, and all other reviewed systems are negative.  ALLERGIES: No Known Allergies  HOME MEDICATIONS: Outpatient Medications Prior to Visit  Medication Sig Dispense Refill   ARIPiprazole (ABILIFY) 5 MG tablet Take 5 mg by mouth at bedtime. (Patient not taking: Reported on 02/24/2022)     CALCIUM PO Take 10 mg by mouth. (Patient not taking: Reported on 02/24/2022)     donepezil (ARICEPT) 5 MG tablet Take 1 tablet (5 mg total) by mouth at bedtime. (Patient not taking: Reported on 02/24/2022) 30 tablet 11    DULoxetine (CYMBALTA) 60 MG capsule TAKE 1 CAPSULE BY MOUTH EVERY DAY (Patient not taking: Reported on 02/24/2022) 90 capsule 3   hydrOXYzine (ATARAX/VISTARIL) 25 MG tablet TAKE 1 TABLET (25 MG TOTAL) BY MOUTH AS NEEDED FOR ITCHING. (Patient not taking: Reported on 02/24/2022) 90 tablet 0   lamoTRIgine (LAMICTAL) 25 MG tablet Take 50 mg by mouth daily. (Patient not taking: Reported on 02/24/2022)     rosuvastatin (CRESTOR) 10 MG tablet Take 10 mg by mouth daily. (Patient not taking: Reported on 02/24/2022)     No facility-administered medications prior to visit.    PAST MEDICAL HISTORY: Past Medical History:  Diagnosis Date   Allergic rhinitis 06/20/2014   ASCUS with positive high risk HPV cervical 07/06/2016   Calcaneus fracture, right 06/20/2014   Dizziness of unknown cause 06/20/2014   Eustachian tube dysfunction 06/20/2014   Generalized anxiety disorder    Hyperkalemia 03/26/2016   Hyperlipidemia    Hypertension  Insomnia 03/28/2014   Left low back pain 08/07/2015   Major depressive disorder 03/28/2014   Malaise and fatigue 03/15/2016   MCI (mild cognitive impairment) with memory loss 02/25/2021   Near syncope 03/17/2018   Nocturia 06/20/2014   Pain in thoracic spine 06/20/2014   Primary osteoarthritis of both knees 03/28/2014   osteoarthritis with anterior horn of the medial meniscus tear   Pure hypercholesterolemia 06/20/2014    PAST SURGICAL HISTORY: Past Surgical History:  Procedure Laterality Date   BLADDER SUSPENSION     tummy tuck      FAMILY HISTORY: Family History  Problem Relation Age of Onset   Alzheimer's disease Mother    Heart attack Father    Hyperlipidemia Father    Alzheimer's disease Father    Cancer Sister        breast   Alzheimer's disease Maternal Grandmother    Cancer Maternal Aunt        breast   Alzheimer's disease Maternal Aunt    Heart attack Paternal Uncle     SOCIAL HISTORY: Social History   Socioeconomic History   Marital  status: Widowed    Spouse name: Not on file   Number of children: 1   Years of education: 21   Highest education level: High school graduate  Occupational History   Not on file  Tobacco Use   Smoking status: Former   Smokeless tobacco: Never  Substance and Sexual Activity   Alcohol use: Yes    Comment: seldom, glass of wine    Drug use: No   Sexual activity: Not on file  Other Topics Concern   Not on file  Social History Narrative   02/24/22 Lives alone    Right handed   Caffeine: decaf    Social Determinants of Health   Financial Resource Strain: Not on file  Food Insecurity: Not on file  Transportation Needs: Not on file  Physical Activity: Not on file  Stress: Not on file  Social Connections: Not on file  Intimate Partner Violence: Not on file      PHYSICAL EXAM  Vitals:   02/24/22 1422  BP: 120/72  Pulse: 73  Weight: 141 lb 12.8 oz (64.3 kg)  Height: 5' 5.5" (1.664 m)   Body mass index is 23.24 kg/m.     02/24/2022    2:23 PM 08/13/2021    2:55 PM 12/04/2020   11:29 AM  MMSE - Mini Mental State Exam  Orientation to time _0 Orientation to Place _1 Registration _2 Attention/ Calculation _3 Recall _4 Language- name 2 objects _5 Language- repeat _6 Language- follow 3 step command _7 Language- read & follow direction _8 Write a sentence _9 Copy design _10 Total score _11 Generalized: Well developed, in no acute distress   Neurological examination  Mentation: Alert oriented to time, place, history taking. Follows all commands speech and language fluent Cranial nerve II-XII: Pupils were equal round reactive to light. Extraocular movements were full, visual field were full on confrontational test. Facial sensation and strength were normal. Uvula tongue midline. Head turning and shoulder shrug  were normal and symmetric. Motor: The motor testing reveals 5 over 5 strength of all 4 extremities. Good  symmetric motor tone is noted throughout.  Sensory: Sensory testing is intact  to soft touch on all 4 extremities. No evidence of extinction is noted.  Coordination: Cerebellar testing reveals good finger-nose-finger and heel-to-shin bilaterally.  Gait and station: Gait is normal.  Reflexes: Deep tendon reflexes are symmetric and normal bilaterally.   DIAGNOSTIC DATA (LABS, IMAGING, TESTING) - I reviewed patient records, labs, notes, testing and imaging myself where available.  Lab Results  Component Value Date   WBC 5.5 12/04/2020   HGB 13.9 12/04/2020   HCT 42.2 12/04/2020   MCV 94 12/04/2020   PLT 361 12/04/2020      Component Value Date/Time   NA 139 12/04/2020 1223   K 5.1 12/04/2020 1223   CL 98 12/04/2020 1223   CO2 22 12/04/2020 1223   GLUCOSE 88 12/04/2020 1223   GLUCOSE 111 05/07/2020 1219   BUN 19 12/04/2020 1223   CREATININE 0.83 12/04/2020 1223   CREATININE 0.79 05/07/2020 1219   CALCIUM 10.5 (H) 12/04/2020 1223   PROT 6.9 12/04/2020 1223   ALBUMIN 4.8 12/04/2020 1223   AST 20 12/04/2020 1223   ALT 22 12/04/2020 1223   ALKPHOS 104 12/04/2020 1223   BILITOT 0.4 12/04/2020 1223   GFRNONAA 76 05/07/2020 1219   GFRAA 88 05/07/2020 1219   Lab Results  Component Value Date   CHOL 143 05/07/2020   HDL 75 05/07/2020   LDLCALC 55 05/07/2020   TRIG 54 05/07/2020   CHOLHDL 1.9 05/07/2020   Lab Results  Component Value Date   HGBA1C 5.6 05/12/2017   Lab Results  Component Value Date   VITAMINB12 479 12/04/2020   Lab Results  Component Value Date   TSH 2.010 12/04/2020      ASSESSMENT AND PLAN 71 y.o. year old female  has a past medical history of Allergic rhinitis (06/20/2014), ASCUS with positive high risk HPV cervical (07/06/2016), Calcaneus fracture, right (06/20/2014), Dizziness of unknown cause (06/20/2014), Eustachian tube dysfunction (06/20/2014), Generalized anxiety disorder, Hyperkalemia (03/26/2016), Hyperlipidemia, Hypertension, Insomnia  (03/28/2014), Left low back pain (08/07/2015), Major depressive disorder (03/28/2014), Malaise and fatigue (03/15/2016), MCI (mild cognitive impairment) with memory loss (02/25/2021), Near syncope (03/17/2018), Nocturia (06/20/2014), Pain in thoracic spine (06/20/2014), Primary osteoarthritis of both knees (03/28/2014), and Pure hypercholesterolemia (06/20/2014). here with:  1.  Memory disturbance-mild neurocognitive impairment  -Encouraged the patient to restart Aricept 5 mg at bedtime -MMSE stable 25/30 -Discussed evaluation at driving school information given to daughter input on AVS -Follow-up in 6 months or sooner if needed     Ward Givens, MSN, NP-C 02/24/2022, 2:31 PM [facility] [address] [place], [place] [address] [phone]

## 2022-06-10 ENCOUNTER — Encounter: Payer: Self-pay | Admitting: Adult Health

## 2022-06-10 NOTE — Telephone Encounter (Signed)
LVM asking for a call back to schedule

## 2022-06-14 ENCOUNTER — Encounter: Payer: Self-pay | Admitting: Adult Health

## 2022-06-14 ENCOUNTER — Ambulatory Visit: Payer: Medicare HMO | Admitting: Adult Health

## 2022-06-14 VITALS — BP 135/75 | HR 72 | Ht 65.0 in | Wt 139.8 lb

## 2022-06-14 DIAGNOSIS — R413 Other amnesia: Secondary | ICD-10-CM

## 2022-06-14 DIAGNOSIS — F03A0 Unspecified dementia, mild, without behavioral disturbance, psychotic disturbance, mood disturbance, and anxiety: Secondary | ICD-10-CM | POA: Diagnosis not present

## 2022-06-14 MED ORDER — DONEPEZIL HCL 5 MG PO TABS: 5.0000 mg | ORAL_TABLET | Freq: Every day | ORAL | 11 refills | Status: DC

## 2022-06-14 NOTE — Patient Instructions (Signed)
Your Plan:  Continue Aricept 5 mg at bedtime. May consider increasing to 10 mg in the future. If your symptoms worsen or you develop new symptoms please let us know.    Thank you for coming to see [place] at [facility]. I hope we have been able to provide you high quality care today.  You may receive a patient satisfaction survey over the next few weeks. We would appreciate your feedback and comments so that we may continue to improve ourselves and the health of our patients.

## 2022-06-14 NOTE — Progress Notes (Signed)
PATIENT: Maria Li DOB: Mar 27, 1951  REASON FOR VISIT: follow up HISTORY FROM: patient PRIMARY NEUROLOGIST: Dr. Jaynee Eagles   Chief Complaint  Patient presents with   Follow-up    Pt in 19 with daughter  Daughter states pt short term memory is worse Daughter states patient has a lot of anxiety  Daughter states patient called her 18 times asking the same questions      HISTORY OF PRESENT ILLNESS: Today 06/14/22: Maria Li is a 71 year old female with a history of memory disturbance.  She returns today for follow-up.  She continues to live at home by herself.  She reports that she never complete all ADLs independently.  She manages her on appointments, finances and medications.  Operates a motor vehicle but stays local. At the last visit she was told to restart Aricept 5 mg at bedtime.  Daughter reports that she has a head time manages household issues. Example, had trouble with roof but brother helped. Daughter reports more anxious and agitated over things. Called daughter 46 times asking the same questions. Daughter reports that she states that she has made comments that she is "doesn't like her life." Daughter has stated that she is moving closer to her around March. Its an independent living facility. Daughter then will help with her medications.    02/24/22: Maria Li is a 71 year old female with a history of memory disturbance.  She returns today for follow-up. Living at home by herself. Able to complete all ADLs.  Patient is here with her daughter.  Her daughter gave me a typed note to let me know that the patient stopped taking her medications abruptly but then started taking them for few days but then stopped again.  She has misplaced her credit card numerous times.  Has to rely on GPS 80% of the time while driving.  She has gotten lost going to familiar places.  She recently went to her granddaughter's graduation but did not recall attending a few hours later even the next day.   Daughter reports that for the past week she has been very sleepy often falling asleep while sitting up.  She is very repetitive.  Behavior wise daughter states that she met a gentleman on Facebook and invited him to come out of town to stay at her house which he did without ever meeting him.  Patient also misplaced some tax documents which resulted in a late estimate tax payment daughter is trying to handle all the finances.  Daughter has talked to the patient about exploring options for future care with the patient defers.   Maria Li is a 71 year old female with a history of memory disturbance.  She returns today for follow-up.  The patient is here with her daughter.  Her daughter currently resides in [place].  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  The patient currently lives at home alone.  She is able to complete all ADLs independently.  She does operate a motor vehicle however the daughter has some concerns about her getting lost.  She states that she had to tell her how to get into her [place].  The patient currently manages her own medications however the daughter has some concerns about that as well.  On a tight note the daughter notes that the patient sometimes get agitated towards loved ones and closest friends.  She is currently not on any memory medication.  She returns today for an evaluation.  HISTORY (  copied from Dr. Cathren Laine note)  Maria Li is a 71 y.o. female here as requested by Silverio Decamp,* for Memory loss.  Past medical history hypertension, hyperlipidemia, uncontrolled anxiety and depression.  She has a family history of Alzheimer's in her mother and maternal aunt and maternal grandmother.   I reviewed primary care provider's notes: Patient has anxiety and depression, she is persistently depressed, she has had some suicidal thoughts but no ideation or plan and no prior attempts, she was  on max dose of Lexapro but switched to Prozac and back to Lexapro started Cymbalta with Wellbutrin.  She is seeing behavioral therapy.  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  I reviewed the chart showed that it does not appear she completed MRI of the brain.  Multiple labs were ordered including RPR, B12, sed rate, CBC, TSH, folate, copper, IFE and CMP and those also have not been completed work-up was ordered at the end of October 2021.  Also chart shows she canceled an earlier new patient appointment in our practice.  It appears she had a recent consult with internal medicine at [facility], also diagnosed with anxiety and depression, I do not see any behavioral health records.   Patient is here with her daughter who also reports much information.  She reports memory loss.  Patient made some financial decisions like closing her bank account she did not remember closing in the spring of last year.  She still drives and denies getting lost.  Daughter states there is still significant memory changes.  Patient lives alone.  Patient feels the pandemic might estimates to do with it because she was by herself or so long. Daughter says they have been working with a psychiatrist for her anxiety and depression. She thinks it started during the pandemic because she didn't get out, she still cooks and she remembers all the recipes, she can follow recipes as well without difficulty, still very social, Daughter says she went out during the pandemic a lot. She has lost 20 pounds over the last 6-9 months, she didn;t eat, she sits in bed and watches TV. She used to exercise more and she keeps saying she needs to join the [employer] again. She manages her own finances, daughter says she was having trouble such as she wrote a check on a closed account she forgot, she thought she sent her estimated taxes and didn;t, what she  used to do once a once she was struggling with. mother is having a hard time with passwords and then she gets locked out and forgets the password. Medications had to change to an automatic dispenser and she forgets taking her meds. She is repeating questions in the same phone conversations.    Reviewed notes, labs and imaging from outside physicians, which showed: see above  REVIEW OF SYSTEMS: Out of a complete 14 system review of symptoms, the patient complains only of the following symptoms, and all other reviewed systems are negative.  ALLERGIES: No Known Allergies  HOME MEDICATIONS: Outpatient Medications Prior to Visit  Medication Sig Dispense Refill   ARIPiprazole (ABILIFY) 5 MG tablet Take 5 mg by mouth at bedtime. (Patient not taking: Reported on 02/24/2022)     CALCIUM PO Take 10 mg by mouth. (Patient not taking: Reported on 02/24/2022)     donepezil (ARICEPT) 5 MG tablet Take 1 tablet (5 mg total) by mouth at  bedtime. (Patient not taking: Reported on 02/24/2022) 30 tablet 11   DULoxetine (CYMBALTA) 60 MG capsule TAKE 1 CAPSULE BY MOUTH EVERY DAY (Patient not taking: Reported on 02/24/2022) 90 capsule 3   hydrOXYzine (ATARAX/VISTARIL) 25 MG tablet TAKE 1 TABLET (25 MG TOTAL) BY MOUTH AS NEEDED FOR ITCHING. (Patient not taking: Reported on 02/24/2022) 90 tablet 0   lamoTRIgine (LAMICTAL) 25 MG tablet Take 50 mg by mouth daily. (Patient not taking: Reported on 02/24/2022)     rosuvastatin (CRESTOR) 10 MG tablet Take 10 mg by mouth daily. (Patient not taking: Reported on 02/24/2022)     No facility-administered medications prior to visit.    PAST MEDICAL HISTORY: Past Medical History:  Diagnosis Date   Allergic rhinitis 06/20/2014   ASCUS with positive high risk HPV cervical 07/06/2016   Calcaneus fracture, right 06/20/2014   Dizziness of unknown cause 06/20/2014   Eustachian tube dysfunction 06/20/2014   Generalized anxiety disorder    Hyperkalemia 03/26/2016   Hyperlipidemia     Hypertension    Insomnia 03/28/2014   Left low back pain 08/07/2015   Major depressive disorder 03/28/2014   Malaise and fatigue 03/15/2016   MCI (mild cognitive impairment) with memory loss 02/25/2021   Near syncope 03/17/2018   Nocturia 06/20/2014   Pain in thoracic spine 06/20/2014   Primary osteoarthritis of both knees 03/28/2014   osteoarthritis with anterior horn of the medial meniscus tear   Pure hypercholesterolemia 06/20/2014    PAST SURGICAL HISTORY: Past Surgical History:  Procedure Laterality Date   BLADDER SUSPENSION     tummy tuck      FAMILY HISTORY: Family History  Problem Relation Age of Onset   Alzheimer's disease Mother    Heart attack Father    Hyperlipidemia Father    Alzheimer's disease Father    Cancer Sister        breast   Alzheimer's disease Maternal Grandmother    Cancer Maternal Aunt        breast   Alzheimer's disease Maternal Aunt    Heart attack Paternal Uncle     SOCIAL HISTORY: Social History   Socioeconomic History   Marital status: Widowed    Spouse name: Not on file   Number of children: 1   Years of education: 71   Highest education level: High school graduate  Occupational History   Not on file  Tobacco Use   Smoking status: Former   Smokeless tobacco: Never  Substance and Sexual Activity   Alcohol use: Yes    Comment: seldom, glass of wine    Drug use: No   Sexual activity: Not on file  Other Topics Concern   Not on file  Social History Narrative   02/24/22 Lives alone    Right handed   Caffeine: decaf    Social Determinants of Health   Financial Resource Strain: Not on file  Food Insecurity: Not on file  Transportation Needs: Not on file  Physical Activity: Not on file  Stress: Not on file  Social Connections: Not on file  Intimate Partner Violence: Not on file      PHYSICAL EXAM  There were no vitals filed for this visit.  There is no height or weight on file to calculate BMI.     06/14/2022    10:17 AM 02/24/2022    2:23 PM 08/13/2021    2:55 PM  MMSE - Mini Mental State Exam  Orientation to time _0 Orientation to Place 5 4  5  Registration _0 Attention/ Calculation _1 Recall _2 Language- name 2 objects _3 Language- repeat _4 Language- follow 3 step command _5 Language- read & follow direction _6 Write a sentence _7 Copy design _8 Total score _9 Generalized: Well developed, in no acute distress   Neurological examination  Mentation: Alert oriented to time, place, history taking. Follows all commands speech and language fluent Cranial nerve II-XII: Pupils were equal round reactive to light. Extraocular movements were full, visual field were full on confrontational test. Facial sensation and strength were normal. Uvula tongue midline. Head turning and shoulder shrug  were normal and symmetric. Motor: The motor testing reveals 5 over 5 strength of all 4 extremities. Good symmetric motor tone is noted throughout.  Sensory: Sensory testing is intact to soft touch on all 4 extremities. No evidence of extinction is noted.  Coordination: Cerebellar testing reveals good finger-nose-finger and heel-to-shin bilaterally.  Gait and station: Gait is normal.  Reflexes: Deep tendon reflexes are symmetric and normal bilaterally.   DIAGNOSTIC DATA (LABS, IMAGING, TESTING) - I reviewed patient records, labs, notes, testing and imaging myself where available.  Lab Results  Component Value Date   WBC 5.5 12/04/2020   HGB 13.9 12/04/2020   HCT 42.2 12/04/2020   MCV 94 12/04/2020   PLT 361 12/04/2020      Component Value Date/Time   NA 139 12/04/2020 1223   K 5.1 12/04/2020 1223   CL 98 12/04/2020 1223   CO2 22 12/04/2020 1223   GLUCOSE 88 12/04/2020 1223   GLUCOSE 111 05/07/2020 1219   BUN 19 12/04/2020 1223   CREATININE 0.83 12/04/2020 1223   CREATININE 0.79 05/07/2020 1219   CALCIUM 10.5 (H) 12/04/2020 1223   PROT 6.9 12/04/2020  1223   ALBUMIN 4.8 12/04/2020 1223   AST 20 12/04/2020 1223   ALT 22 12/04/2020 1223   ALKPHOS 104 12/04/2020 1223   BILITOT 0.4 12/04/2020 1223   GFRNONAA 76 05/07/2020 1219   GFRAA 88 05/07/2020 1219   Lab Results  Component Value Date   CHOL 143 05/07/2020   HDL 75 05/07/2020   LDLCALC 55 05/07/2020   TRIG 54 05/07/2020   CHOLHDL 1.9 05/07/2020   Lab Results  Component Value Date   HGBA1C 5.6 05/12/2017   Lab Results  Component Value Date   VITAMINB12 479 12/04/2020   Lab Results  Component Value Date   TSH 2.010 12/04/2020      ASSESSMENT AND PLAN 71 y.o. year old female  has a past medical history of Allergic rhinitis (06/20/2014), ASCUS with positive high risk HPV cervical (07/06/2016), Calcaneus fracture, right (06/20/2014), Dizziness of unknown cause (06/20/2014), Eustachian tube dysfunction (06/20/2014), Generalized anxiety disorder, Hyperkalemia (03/26/2016), Hyperlipidemia, Hypertension, Insomnia (03/28/2014), Left low back pain (08/07/2015), Major depressive disorder (03/28/2014), Malaise and fatigue (03/15/2016), MCI (mild cognitive impairment) with memory loss (02/25/2021), Near syncope (03/17/2018), Nocturia (06/20/2014), Pain in thoracic spine (06/20/2014), Primary osteoarthritis of both knees (03/28/2014), and Pure hypercholesterolemia (06/20/2014). here with:  1.  Memory disturbance-mild neurocognitive impairment  -Continue Aricept 5 mg at bedtime discussed increasing to 10 mg but daughter will wait until she finishes the current bottle so the patient does not get confused with her medication -Advised that I do feel that her anxiety will improve when she is closer to her daughter however if  she feels that symptoms are worsening they can let our office or her PCP know. -MMSE stable 24/30 -Follow-up in 6 months or sooner if needed     Ward Givens, MSN, NP-C 06/14/2022, 10:03 AM [facility] [address] [place], [place]  [address] [phone]

## 2022-08-27 ENCOUNTER — Other Ambulatory Visit: Payer: Self-pay | Admitting: Adult Health

## 2022-08-31 NOTE — Telephone Encounter (Signed)
Rx sent as per last office visit note. 

## 2022-09-21 ENCOUNTER — Ambulatory Visit: Payer: Medicare HMO | Admitting: Adult Health

## 2022-11-10 ENCOUNTER — Encounter: Payer: Self-pay | Admitting: Adult Health

## 2022-12-27 ENCOUNTER — Ambulatory Visit: Payer: Medicare HMO | Admitting: Adult Health

## 2023-10-18 ENCOUNTER — Other Ambulatory Visit: Payer: Self-pay | Admitting: Adult Health

## 2023-11-19 ENCOUNTER — Other Ambulatory Visit: Payer: Self-pay | Admitting: Adult Health

## 2023-12-10 ENCOUNTER — Other Ambulatory Visit: Payer: Self-pay | Admitting: Adult Health

## 2023-12-21 ENCOUNTER — Other Ambulatory Visit: Payer: Self-pay | Admitting: Adult Health
